# Patient Record
Sex: Male | Born: 1955 | Race: Black or African American | Hispanic: No
Health system: Southern US, Community
[De-identification: ages and names within clinical notes are randomized; demographics above are authoritative.]

## PROBLEM LIST (undated history)

## (undated) DIAGNOSIS — J1282 Pneumonia due to coronavirus disease 2019: Secondary | ICD-10-CM

## (undated) DIAGNOSIS — U071 COVID-19: Secondary | ICD-10-CM

## (undated) DIAGNOSIS — J9621 Acute and chronic respiratory failure with hypoxia: Secondary | ICD-10-CM

## (undated) DIAGNOSIS — J80 Acute respiratory distress syndrome: Secondary | ICD-10-CM

---

## 2020-09-05 ENCOUNTER — Inpatient Hospital Stay
Admission: RE | Admit: 2020-09-05 | Discharge: 2020-09-29 | Disposition: A | Discharge status: Other Acute Inpatient Hospital | Payer: BC Managed Care – PPO | Attending: Internal Medicine | Admitting: Internal Medicine

## 2020-09-05 DIAGNOSIS — J90 Pleural effusion, not elsewhere classified: Secondary | ICD-10-CM

## 2020-09-05 DIAGNOSIS — J80 Acute respiratory distress syndrome: Secondary | ICD-10-CM | POA: Diagnosis present

## 2020-09-05 DIAGNOSIS — U071 COVID-19: Secondary | ICD-10-CM | POA: Diagnosis present

## 2020-09-05 DIAGNOSIS — J189 Pneumonia, unspecified organism: Secondary | ICD-10-CM

## 2020-09-05 DIAGNOSIS — N19 Unspecified kidney failure: Secondary | ICD-10-CM

## 2020-09-05 DIAGNOSIS — J9621 Acute and chronic respiratory failure with hypoxia: Secondary | ICD-10-CM | POA: Diagnosis present

## 2020-09-05 DIAGNOSIS — K567 Ileus, unspecified: Secondary | ICD-10-CM

## 2020-09-05 DIAGNOSIS — R509 Fever, unspecified: Secondary | ICD-10-CM

## 2020-09-05 DIAGNOSIS — T85598A Other mechanical complication of other gastrointestinal prosthetic devices, implants and grafts, initial encounter: Secondary | ICD-10-CM

## 2020-09-05 DIAGNOSIS — J9690 Respiratory failure, unspecified, unspecified whether with hypoxia or hypercapnia: Secondary | ICD-10-CM

## 2020-09-05 DIAGNOSIS — J1282 Pneumonia due to coronavirus disease 2019: Secondary | ICD-10-CM | POA: Diagnosis present

## 2020-09-05 DIAGNOSIS — R6889 Other general symptoms and signs: Secondary | ICD-10-CM

## 2020-09-05 HISTORY — DX: Acute and chronic respiratory failure with hypoxia: J96.21

## 2020-09-05 HISTORY — DX: Pneumonia due to coronavirus disease 2019: J12.82

## 2020-09-05 HISTORY — DX: Acute respiratory distress syndrome: J80

## 2020-09-05 HISTORY — DX: COVID-19: U07.1

## 2020-09-06 ENCOUNTER — Encounter: Payer: Self-pay | Admitting: Internal Medicine

## 2020-09-06 ENCOUNTER — Other Ambulatory Visit (HOSPITAL_COMMUNITY): Payer: BC Managed Care – PPO

## 2020-09-06 DIAGNOSIS — J80 Acute respiratory distress syndrome: Secondary | ICD-10-CM | POA: Diagnosis not present

## 2020-09-06 DIAGNOSIS — J9621 Acute and chronic respiratory failure with hypoxia: Secondary | ICD-10-CM

## 2020-09-06 DIAGNOSIS — U071 COVID-19: Secondary | ICD-10-CM | POA: Diagnosis not present

## 2020-09-06 DIAGNOSIS — J1282 Pneumonia due to coronavirus disease 2019: Secondary | ICD-10-CM

## 2020-09-06 LAB — CBC
HCT: 42.5 % (ref 39.0–52.0)
Hemoglobin: 14.2 g/dL (ref 13.0–17.0)
MCH: 28.5 pg (ref 26.0–34.0)
MCHC: 33.4 g/dL (ref 30.0–36.0)
MCV: 85.3 fL (ref 80.0–100.0)
Platelets: 243 10*3/uL (ref 150–400)
RBC: 4.98 MIL/uL (ref 4.22–5.81)
RDW: 14.8 % (ref 11.5–15.5)
WBC: 17.4 10*3/uL — ABNORMAL HIGH (ref 4.0–10.5)
nRBC: 0 % (ref 0.0–0.2)

## 2020-09-06 LAB — COMPREHENSIVE METABOLIC PANEL
ALT: 124 U/L — ABNORMAL HIGH (ref 0–44)
AST: 47 U/L — ABNORMAL HIGH (ref 15–41)
Albumin: 2.9 g/dL — ABNORMAL LOW (ref 3.5–5.0)
Alkaline Phosphatase: 75 U/L (ref 38–126)
Anion gap: 10 (ref 5–15)
BUN: 34 mg/dL — ABNORMAL HIGH (ref 8–23)
CO2: 22 mmol/L (ref 22–32)
Calcium: 8.9 mg/dL (ref 8.9–10.3)
Chloride: 101 mmol/L (ref 98–111)
Creatinine, Ser: 0.71 mg/dL (ref 0.61–1.24)
GFR, Estimated: >60 mL/min (ref >60)
Glucose, Bld: 100 mg/dL — ABNORMAL HIGH (ref 70–99)
Potassium: 3.7 mmol/L (ref 3.5–5.1)
Sodium: 133 mmol/L — ABNORMAL LOW (ref 135–145)
Total Bilirubin: 1.2 mg/dL (ref 0.3–1.2)
Total Protein: 7 g/dL (ref 6.5–8.1)

## 2020-09-06 LAB — URINALYSIS, ROUTINE W REFLEX MICROSCOPIC
Bilirubin Urine: NEGATIVE
Glucose, UA: NEGATIVE mg/dL
Hgb urine dipstick: NEGATIVE
Ketones, ur: NEGATIVE mg/dL
Leukocytes,Ua: NEGATIVE
Nitrite: NEGATIVE
Protein, ur: NEGATIVE mg/dL
Specific Gravity, Urine: 1.018 (ref 1.005–1.030)
pH: 5 (ref 5.0–8.0)

## 2020-09-06 NOTE — Consult Note (Signed)
Pulmonary Critical Care Medicine [facility] [place]  PULMONARY SERVICE  Date of Service: 09/06/2020  PULMONARY CRITICAL CARE Edward Andrade  XNA:[id number]  DOB: [birth year]   DOA: 09/05/2020  Referring Physician: Carron Curie, MD  HPI: Edward Andrade is a 64 y.o. male seen for follow up of Acute on Chronic Respiratory Failure.  Patient has multiple medical problems including colon cancer and COVID-19 infection came into the hospital because of increasing shortness of breath.  Patient was apparently admitted with a history of COVID-19 on October 16 ended up on the ventilator mechanical ventilation and was subsequently extubated on November 8.  Transferred to this facility on high flow oxygen at 30 L and 50% FiO2.  Patient had a complicated course was treated with bacterial infection with antibiotics.  Patient also had low blood pressure requiring pressors and elevation of liver enzymes.  Transferred here for further management.  Review of Systems:  ROS performed and is unremarkable other than noted above.  Past medical history: Cancer of the colon COVID-19 virus infection COVID-19 pneumonia Transaminitis Hyperglycemia  Past surgical history: Intubation mechanical ventilation  Social history: No alcohol tobacco or drug abuse Patient is a former smoker  Medications: Reviewed on Rounds  Physical Exam:  Vitals: Temperature 96.8 pulse 85 respiratory rate 20 blood pressure is 138/81 saturations 96%  Ventilator Settings off the ventilator on high flow oxygen 30 L with an FiO2 of 50%  . General: Comfortable at this time . Eyes: Grossly normal lids, irises & conjunctiva . ENT: grossly tongue is normal . Neck: no obvious mass . Cardiovascular: S1-S2 normal no gallop or rub . Respiratory: No rhonchi no rales . Abdomen: Soft and nontender . Skin: no rash seen on limited exam . Musculoskeletal: not rigid . Psychiatric:unable to assess . Neurologic: no  seizure no involuntary movements         Labs on Admission:  Basic Metabolic Panel: Recent Labs  Lab 09/06/20 0413  NA 133*  K 3.7  CL 101  CO2 22  GLUCOSE 100*  BUN 34*  CREATININE 0.71  CALCIUM 8.9    No results for input(s): PHART, PCO2ART, PO2ART, HCO3, O2SAT in the last 168 hours.  Liver Function Tests: Recent Labs  Lab 09/06/20 0413  AST 47*  ALT 124*  ALKPHOS 75  BILITOT 1.2  PROT 7.0  ALBUMIN 2.9*   No results for input(s): LIPASE, AMYLASE in the last 168 hours. No results for input(s): AMMONIA in the last 168 hours.  CBC: Recent Labs  Lab 09/06/20 0413  WBC 17.4*  HGB 14.2  HCT 42.5  MCV 85.3  PLT 243    Cardiac Enzymes: No results for input(s): CKTOTAL, CKMB, CKMBINDEX, TROPONINI in the last 168 hours.  BNP (last 3 results) No results for input(s): BNP in the last 8760 hours.  ProBNP (last 3 results) No results for input(s): PROBNP in the last 8760 hours.   Radiological Exams on Admission: DG Abd Portable 1V  Result Date: 09/06/2020 CLINICAL DATA:  Impaired nasogastric tube. EXAM: PORTABLE ABDOMEN - 1 VIEW COMPARISON:  09/06/2020. FINDINGS: Interim removal of feeding tube. Interim placement of NG tube. NG tube coiled over the region of the upper stomach. Distended loops of small and large bowel noted. This could represent adynamic ileus. To demonstrate resolution and to exclude bowel obstruction follow-up exam suggested. No free air bibasilar atelectasis. Degenerative changes scoliosis thoracolumbar spine. IMPRESSION: 1. Interim removal of feeding tube. Interim placement of NG tube. NG tube coiled over the  region of the upper stomach. 2. Distended loops of small and large bowel noted. This could represent adynamic ileus. To demonstrate resolution to exclude bowel obstruction follow-up exam suggested. Electronically Signed   By: Maisie Fus  Register   On: 09/06/2020 06:44   DG Abd Portable 1V  Result Date: 09/06/2020 CLINICAL DATA:  Nasoenteric  feeding tube placement EXAM: PORTABLE ABDOMEN - 1 VIEW COMPARISON:  None. FINDINGS: A nasoenteric feeding tube is seen coiled within the upper abdomen, likely within the third portion of the duodenum. Visualized abdominal gas pattern is unremarkable. IMPRESSION: Nasoenteric feeding tube likely within the third portion of the duodenum. Electronically Signed   By: Helyn Numbers MD   On: 09/06/2020 02:56    Assessment/Plan Active Problems:   Acute on chronic respiratory failure with hypoxia (HCC)   COVID-19 virus infection   Pneumonia due to COVID-19 virus   Acute respiratory distress syndrome (ARDS) due to COVID-19 virus (HCC)   1. Acute on chronic respiratory failure hypoxia patient is currently on high flow oxygen goal is going to be to wean the flow rate and FiO2 down as tolerated.  Spoke with respiratory therapy on rounds to move aggressively.  Patient continues to have issues with coughing however 2. COVID-19 virus infection in recovery we will continue to monitor closely. 3. COVID-19 pneumonia treated patient had subsequent bacterial infection was treated with antibiotics. 4. ARDS secondary to COVID-19.  The most recent films showing some residual changes will need to monitor to resolution.  I have personally seen and evaluated the patient, evaluated laboratory and imaging results, formulated the assessment and plan and placed orders. The Patient requires high complexity decision making with multiple systems involvement.  Case was discussed on Rounds with the Respiratory Therapy Director and the Respiratory staff Time Spent  Yevonne Pax, MD [facility] Pulmonary Critical Care Medicine Sleep Medicine

## 2020-09-07 ENCOUNTER — Other Ambulatory Visit (HOSPITAL_COMMUNITY): Payer: BC Managed Care – PPO

## 2020-09-07 DIAGNOSIS — J1282 Pneumonia due to coronavirus disease 2019: Secondary | ICD-10-CM | POA: Diagnosis not present

## 2020-09-07 DIAGNOSIS — J9621 Acute and chronic respiratory failure with hypoxia: Secondary | ICD-10-CM | POA: Diagnosis not present

## 2020-09-07 DIAGNOSIS — U071 COVID-19: Secondary | ICD-10-CM | POA: Diagnosis not present

## 2020-09-07 DIAGNOSIS — J80 Acute respiratory distress syndrome: Secondary | ICD-10-CM | POA: Diagnosis not present

## 2020-09-07 LAB — BASIC METABOLIC PANEL
Anion gap: 8 (ref 5–15)
BUN: 29 mg/dL — ABNORMAL HIGH (ref 8–23)
CO2: 26 mmol/L (ref 22–32)
Calcium: 8.7 mg/dL — ABNORMAL LOW (ref 8.9–10.3)
Chloride: 102 mmol/L (ref 98–111)
Creatinine, Ser: 0.66 mg/dL (ref 0.61–1.24)
GFR, Estimated: >60 mL/min (ref >60)
Glucose, Bld: 98 mg/dL (ref 70–99)
Potassium: 3.5 mmol/L (ref 3.5–5.1)
Sodium: 136 mmol/L (ref 135–145)

## 2020-09-07 LAB — MAGNESIUM: Magnesium: 2.2 mg/dL (ref 1.7–2.4)

## 2020-09-07 NOTE — Progress Notes (Signed)
Pulmonary Critical Care Medicine [facility] [place]   PULMONARY CRITICAL CARE SERVICE  PROGRESS NOTE  Date of Service: 09/07/2020  Edward Andrade  MHD:[id number]  DOB: July 29, 1956   DOA: 09/05/2020  Referring Physician: Carron Curie, MD  HPI: Edward Andrade is a 64 y.o. male seen for follow up of Acute on Chronic Respiratory Failure.  Patient is on 5 L of O2 good saturations are noted.  Medications: Reviewed on Rounds  Physical Exam:  Vitals: Temperature is 96.9 pulse 61 respiratory rate 16 blood pressure 100/61 saturations 99%  Ventilator Settings off the ventilator on 5 L of oxygen  . General: Comfortable at this time . Eyes: Grossly normal lids, irises & conjunctiva . ENT: grossly tongue is normal . Neck: no obvious mass . Cardiovascular: S1 S2 normal no gallop . Respiratory: No rhonchi no rales noted at this time. . Abdomen: soft . Skin: no rash seen on limited exam . Musculoskeletal: not rigid . Psychiatric:unable to assess . Neurologic: no seizure no involuntary movements         Lab Data:   Basic Metabolic Panel: Recent Labs  Lab 09/06/20 0413 09/07/20 0507  NA 133* 136  K 3.7 3.5  CL 101 102  CO2 22 26  GLUCOSE 100* 98  BUN 34* 29*  CREATININE 0.71 0.66  CALCIUM 8.9 8.7*  MG  --  2.2    ABG: No results for input(s): PHART, PCO2ART, PO2ART, HCO3, O2SAT in the last 168 hours.  Liver Function Tests: Recent Labs  Lab 09/06/20 0413  AST 47*  ALT 124*  ALKPHOS 75  BILITOT 1.2  PROT 7.0  ALBUMIN 2.9*   No results for input(s): LIPASE, AMYLASE in the last 168 hours. No results for input(s): AMMONIA in the last 168 hours.  CBC: Recent Labs  Lab 09/06/20 0413  WBC 17.4*  HGB 14.2  HCT 42.5  MCV 85.3  PLT 243    Cardiac Enzymes: No results for input(s): CKTOTAL, CKMB, CKMBINDEX, TROPONINI in the last 168 hours.  BNP (last 3 results) No results for input(s): BNP in the last 8760 hours.  ProBNP (last 3 results) No results  for input(s): PROBNP in the last 8760 hours.  Radiological Exams: DG CHEST PORT 1 VIEW  Result Date: 09/07/2020 CLINICAL DATA:  Shortness of breath. EXAM: PORTABLE CHEST 1 VIEW COMPARISON:  None. FINDINGS: This is a low volume study. A LEFT PICC line is present with tip overlying the UPPER RIGHT atrium. An NG tube is noted entering the stomach. Mild diffuse bilateral airspace/ground-glass opacities are noted. No pleural effusion, pneumothorax or acute bony abnormality. IMPRESSION: 1. Mild diffuse bilateral airspace/ground-glass opacities - question edema versus infection. 2. LEFT PICC line with tip overlying the UPPER RIGHT atrium. Electronically Signed   By: Harmon Pier M.D.   On: 09/07/2020 08:35   DG Abd Portable 1V  Result Date: 09/07/2020 CLINICAL DATA:  NG tube placement. EXAM: PORTABLE ABDOMEN - 1 VIEW COMPARISON:  09/06/2020 FINDINGS: An NG tube is present with tip overlying the mid stomach. Mild gaseous distension of the colon is again noted. IMPRESSION: NG tube with tip overlying the mid stomach. Electronically Signed   By: Harmon Pier M.D.   On: 09/07/2020 08:36   DG Abd Portable 1V  Result Date: 09/06/2020 CLINICAL DATA:  Impaired nasogastric tube. EXAM: PORTABLE ABDOMEN - 1 VIEW COMPARISON:  09/06/2020. FINDINGS: Interim removal of feeding tube. Interim placement of NG tube. NG tube coiled over the region of the upper stomach. Distended loops of  small and large bowel noted. This could represent adynamic ileus. To demonstrate resolution and to exclude bowel obstruction follow-up exam suggested. No free air bibasilar atelectasis. Degenerative changes scoliosis thoracolumbar spine. IMPRESSION: 1. Interim removal of feeding tube. Interim placement of NG tube. NG tube coiled over the region of the upper stomach. 2. Distended loops of small and large bowel noted. This could represent adynamic ileus. To demonstrate resolution to exclude bowel obstruction follow-up exam suggested. Electronically  Signed   By: Maisie Fus  Register   On: 09/06/2020 06:44   DG Abd Portable 1V  Result Date: 09/06/2020 CLINICAL DATA:  Nasoenteric feeding tube placement EXAM: PORTABLE ABDOMEN - 1 VIEW COMPARISON:  None. FINDINGS: A nasoenteric feeding tube is seen coiled within the upper abdomen, likely within the third portion of the duodenum. Visualized abdominal gas pattern is unremarkable. IMPRESSION: Nasoenteric feeding tube likely within the third portion of the duodenum. Electronically Signed   By: Helyn Numbers MD   On: 09/06/2020 02:56    Assessment/Plan Active Problems:   Acute on chronic respiratory failure with hypoxia (HCC)   COVID-19 virus infection   Pneumonia due to COVID-19 virus   Acute respiratory distress syndrome (ARDS) due to COVID-19 virus (HCC)   1. Acute on chronic respiratory failure hypoxia we will continue with oxygen therapy titrate as tolerated.  Right now is down to 5 L 2. COVID-19 virus infection in recovery 3. Pneumonia due to COVID-19 treated 4. ARDS treated slow improvement   I have personally seen and evaluated the patient, evaluated laboratory and imaging results, formulated the assessment and plan and placed orders. The Patient requires high complexity decision making with multiple systems involvement.  Rounds were done with the Respiratory Therapy Director and Staff therapists and discussed with nursing staff also.  Yevonne Pax, MD [facility] Pulmonary Critical Care Medicine Sleep Medicine

## 2020-09-08 DIAGNOSIS — J1282 Pneumonia due to coronavirus disease 2019: Secondary | ICD-10-CM | POA: Diagnosis not present

## 2020-09-08 DIAGNOSIS — J80 Acute respiratory distress syndrome: Secondary | ICD-10-CM | POA: Diagnosis not present

## 2020-09-08 DIAGNOSIS — J9621 Acute and chronic respiratory failure with hypoxia: Secondary | ICD-10-CM | POA: Diagnosis not present

## 2020-09-08 DIAGNOSIS — U071 COVID-19: Secondary | ICD-10-CM | POA: Diagnosis not present

## 2020-09-08 LAB — BASIC METABOLIC PANEL
Anion gap: 10 (ref 5–15)
BUN: 25 mg/dL — ABNORMAL HIGH (ref 8–23)
CO2: 25 mmol/L (ref 22–32)
Calcium: 9 mg/dL (ref 8.9–10.3)
Chloride: 102 mmol/L (ref 98–111)
Creatinine, Ser: 0.61 mg/dL (ref 0.61–1.24)
GFR, Estimated: >60 mL/min (ref >60)
Glucose, Bld: 107 mg/dL — ABNORMAL HIGH (ref 70–99)
Potassium: 3.4 mmol/L — ABNORMAL LOW (ref 3.5–5.1)
Sodium: 137 mmol/L (ref 135–145)

## 2020-09-08 LAB — URINE CULTURE: Culture: 20000 — AB

## 2020-09-08 LAB — MAGNESIUM: Magnesium: 2 mg/dL (ref 1.7–2.4)

## 2020-09-08 NOTE — Progress Notes (Signed)
Pulmonary Critical Care Medicine [facility] [place]   PULMONARY CRITICAL CARE SERVICE  PROGRESS NOTE  Date of Service: 09/08/2020  Edward Andrade  YIR:[id number]  DOB: 06-17-1956   DOA: 09/05/2020  Referring Physician: Carron Curie, MD  HPI: Edward Andrade is a 64 y.o. male seen for follow up of Acute on Chronic Respiratory Failure.  Currently off the ventilator on 5 L of oxygen good saturations are noted  Medications: Reviewed on Rounds  Physical Exam:  Vitals: Temperature is 96.0 pulse 84 respiratory 26 blood pressure is 141/83 saturations 99%  Ventilator Settings off the vent on 5 L  . General: Comfortable at this time . Eyes: Grossly normal lids, irises & conjunctiva . ENT: grossly tongue is normal . Neck: no obvious mass . Cardiovascular: S1 S2 normal no gallop . Respiratory: No rhonchi no rales are noted at this time . Abdomen: soft . Skin: no rash seen on limited exam . Musculoskeletal: not rigid . Psychiatric:unable to assess . Neurologic: no seizure no involuntary movements         Lab Data:   Basic Metabolic Panel: Recent Labs  Lab 09/06/20 0413 09/07/20 0507 09/08/20 0626  NA 133* 136 137  K 3.7 3.5 3.4*  CL 101 102 102  CO2 22 26 25   GLUCOSE 100* 98 107*  BUN 34* 29* 25*  CREATININE 0.71 0.66 0.61  CALCIUM 8.9 8.7* 9.0  MG  --  2.2 2.0    ABG: No results for input(s): PHART, PCO2ART, PO2ART, HCO3, O2SAT in the last 168 hours.  Liver Function Tests: Recent Labs  Lab 09/06/20 0413  AST 47*  ALT 124*  ALKPHOS 75  BILITOT 1.2  PROT 7.0  ALBUMIN 2.9*   No results for input(s): LIPASE, AMYLASE in the last 168 hours. No results for input(s): AMMONIA in the last 168 hours.  CBC: Recent Labs  Lab 09/06/20 0413  WBC 17.4*  HGB 14.2  HCT 42.5  MCV 85.3  PLT 243    Cardiac Enzymes: No results for input(s): CKTOTAL, CKMB, CKMBINDEX, TROPONINI in the last 168 hours.  BNP (last 3 results) No results for input(s): BNP in  the last 8760 hours.  ProBNP (last 3 results) No results for input(s): PROBNP in the last 8760 hours.  Radiological Exams: DG CHEST PORT 1 VIEW  Result Date: 09/07/2020 CLINICAL DATA:  Shortness of breath. EXAM: PORTABLE CHEST 1 VIEW COMPARISON:  None. FINDINGS: This is a low volume study. A LEFT PICC line is present with tip overlying the UPPER RIGHT atrium. An NG tube is noted entering the stomach. Mild diffuse bilateral airspace/ground-glass opacities are noted. No pleural effusion, pneumothorax or acute bony abnormality. IMPRESSION: 1. Mild diffuse bilateral airspace/ground-glass opacities - question edema versus infection. 2. LEFT PICC line with tip overlying the UPPER RIGHT atrium. Electronically Signed   By: 09/09/2020 M.D.   On: 09/07/2020 08:35   DG Abd Portable 1V  Result Date: 09/07/2020 CLINICAL DATA:  NG tube placement. EXAM: PORTABLE ABDOMEN - 1 VIEW COMPARISON:  09/06/2020 FINDINGS: An NG tube is present with tip overlying the mid stomach. Mild gaseous distension of the colon is again noted. IMPRESSION: NG tube with tip overlying the mid stomach. Electronically Signed   By: 09/08/2020 M.D.   On: 09/07/2020 08:36    Assessment/Plan Active Problems:   Acute on chronic respiratory failure with hypoxia (HCC)   COVID-19 virus infection   Pneumonia due to COVID-19 virus   Acute respiratory distress syndrome (ARDS) due to  COVID-19 virus (HCC)   1. Acute on chronic respiratory failure hypoxia we will continue with oxygen therapy titrate down as tolerated. 2. COVID-19 virus infection in recovery 3. Pneumonia due to COVID-19 treated 4. ARDS treated slow improvement   I have personally seen and evaluated the patient, evaluated laboratory and imaging results, formulated the assessment and plan and placed orders. The Patient requires high complexity decision making with multiple systems involvement.  Rounds were done with the Respiratory Therapy Director and Staff therapists and  discussed with nursing staff also.  Yevonne Pax, MD [facility] Pulmonary Critical Care Medicine Sleep Medicine

## 2020-09-09 ENCOUNTER — Other Ambulatory Visit (HOSPITAL_COMMUNITY): Payer: BC Managed Care – PPO

## 2020-09-09 LAB — CBC
HCT: 40.6 % (ref 39.0–52.0)
Hemoglobin: 13.2 g/dL (ref 13.0–17.0)
MCH: 28.1 pg (ref 26.0–34.0)
MCHC: 32.5 g/dL (ref 30.0–36.0)
MCV: 86.4 fL (ref 80.0–100.0)
Platelets: 196 10*3/uL (ref 150–400)
RBC: 4.7 MIL/uL (ref 4.22–5.81)
RDW: 14.8 % (ref 11.5–15.5)
WBC: 8.6 10*3/uL (ref 4.0–10.5)
nRBC: 0 % (ref 0.0–0.2)

## 2020-09-09 LAB — MAGNESIUM: Magnesium: 2 mg/dL (ref 1.7–2.4)

## 2020-09-09 LAB — BASIC METABOLIC PANEL
Anion gap: 10 (ref 5–15)
BUN: 21 mg/dL (ref 8–23)
CO2: 26 mmol/L (ref 22–32)
Calcium: 8.8 mg/dL — ABNORMAL LOW (ref 8.9–10.3)
Chloride: 100 mmol/L (ref 98–111)
Creatinine, Ser: 0.61 mg/dL (ref 0.61–1.24)
GFR, Estimated: >60 mL/min (ref >60)
Glucose, Bld: 124 mg/dL — ABNORMAL HIGH (ref 70–99)
Potassium: 3.3 mmol/L — ABNORMAL LOW (ref 3.5–5.1)
Sodium: 136 mmol/L (ref 135–145)

## 2020-09-10 LAB — BASIC METABOLIC PANEL
Anion gap: 12 (ref 5–15)
BUN: 14 mg/dL (ref 8–23)
CO2: 23 mmol/L (ref 22–32)
Calcium: 8.5 mg/dL — ABNORMAL LOW (ref 8.9–10.3)
Chloride: 101 mmol/L (ref 98–111)
Creatinine, Ser: 0.51 mg/dL — ABNORMAL LOW (ref 0.61–1.24)
GFR, Estimated: >60 mL/min (ref >60)
Glucose, Bld: 88 mg/dL (ref 70–99)
Potassium: 3.5 mmol/L (ref 3.5–5.1)
Sodium: 136 mmol/L (ref 135–145)

## 2020-09-10 LAB — CBC
HCT: 40 % (ref 39.0–52.0)
Hemoglobin: 13 g/dL (ref 13.0–17.0)
MCH: 28.1 pg (ref 26.0–34.0)
MCHC: 32.5 g/dL (ref 30.0–36.0)
MCV: 86.4 fL (ref 80.0–100.0)
Platelets: 188 10*3/uL (ref 150–400)
RBC: 4.63 MIL/uL (ref 4.22–5.81)
RDW: 14.9 % (ref 11.5–15.5)
WBC: 7 10*3/uL (ref 4.0–10.5)
nRBC: 0 % (ref 0.0–0.2)

## 2020-09-10 LAB — PHOSPHORUS: Phosphorus: 3.7 mg/dL (ref 2.5–4.6)

## 2020-09-10 LAB — MAGNESIUM: Magnesium: 1.9 mg/dL (ref 1.7–2.4)

## 2020-09-11 ENCOUNTER — Other Ambulatory Visit (HOSPITAL_COMMUNITY): Payer: BC Managed Care – PPO

## 2020-09-11 LAB — BASIC METABOLIC PANEL
Anion gap: 7 (ref 5–15)
BUN: 14 mg/dL (ref 8–23)
CO2: 23 mmol/L (ref 22–32)
Calcium: 8.7 mg/dL — ABNORMAL LOW (ref 8.9–10.3)
Chloride: 107 mmol/L (ref 98–111)
Creatinine, Ser: 0.61 mg/dL (ref 0.61–1.24)
GFR, Estimated: >60 mL/min (ref >60)
Glucose, Bld: 83 mg/dL (ref 70–99)
Potassium: 4.1 mmol/L (ref 3.5–5.1)
Sodium: 137 mmol/L (ref 135–145)

## 2020-09-11 LAB — PHOSPHORUS: Phosphorus: 3.5 mg/dL (ref 2.5–4.6)

## 2020-09-11 LAB — MAGNESIUM: Magnesium: 1.9 mg/dL (ref 1.7–2.4)

## 2020-09-13 ENCOUNTER — Other Ambulatory Visit (HOSPITAL_COMMUNITY): Payer: BC Managed Care – PPO

## 2020-09-13 LAB — BASIC METABOLIC PANEL
Anion gap: 9 (ref 5–15)
BUN: 11 mg/dL (ref 8–23)
CO2: 21 mmol/L — ABNORMAL LOW (ref 22–32)
Calcium: 8.6 mg/dL — ABNORMAL LOW (ref 8.9–10.3)
Chloride: 107 mmol/L (ref 98–111)
Creatinine, Ser: 0.47 mg/dL — ABNORMAL LOW (ref 0.61–1.24)
GFR, Estimated: >60 mL/min (ref >60)
Glucose, Bld: 89 mg/dL (ref 70–99)
Potassium: 3.1 mmol/L — ABNORMAL LOW (ref 3.5–5.1)
Sodium: 137 mmol/L (ref 135–145)

## 2020-09-13 LAB — MAGNESIUM: Magnesium: 1.7 mg/dL (ref 1.7–2.4)

## 2020-09-13 LAB — CBC
HCT: 37.3 % — ABNORMAL LOW (ref 39.0–52.0)
Hemoglobin: 12.1 g/dL — ABNORMAL LOW (ref 13.0–17.0)
MCH: 28.4 pg (ref 26.0–34.0)
MCHC: 32.4 g/dL (ref 30.0–36.0)
MCV: 87.6 fL (ref 80.0–100.0)
Platelets: 157 10*3/uL (ref 150–400)
RBC: 4.26 MIL/uL (ref 4.22–5.81)
RDW: 15.1 % (ref 11.5–15.5)
WBC: 5 10*3/uL (ref 4.0–10.5)
nRBC: 0 % (ref 0.0–0.2)

## 2020-09-13 LAB — PHOSPHORUS: Phosphorus: 3.5 mg/dL (ref 2.5–4.6)

## 2020-09-14 LAB — BASIC METABOLIC PANEL
Anion gap: 9 (ref 5–15)
BUN: 10 mg/dL (ref 8–23)
CO2: 22 mmol/L (ref 22–32)
Calcium: 8.3 mg/dL — ABNORMAL LOW (ref 8.9–10.3)
Chloride: 106 mmol/L (ref 98–111)
Creatinine, Ser: 0.58 mg/dL — ABNORMAL LOW (ref 0.61–1.24)
GFR, Estimated: >60 mL/min (ref >60)
Glucose, Bld: 91 mg/dL (ref 70–99)
Potassium: 3.3 mmol/L — ABNORMAL LOW (ref 3.5–5.1)
Sodium: 137 mmol/L (ref 135–145)

## 2020-09-14 LAB — PHOSPHORUS: Phosphorus: 3.1 mg/dL (ref 2.5–4.6)

## 2020-09-14 LAB — MAGNESIUM: Magnesium: 1.8 mg/dL (ref 1.7–2.4)

## 2020-09-15 ENCOUNTER — Other Ambulatory Visit (HOSPITAL_COMMUNITY): Payer: BC Managed Care – PPO

## 2020-09-15 LAB — BASIC METABOLIC PANEL
Anion gap: 7 (ref 5–15)
BUN: 9 mg/dL (ref 8–23)
CO2: 25 mmol/L (ref 22–32)
Calcium: 8.3 mg/dL — ABNORMAL LOW (ref 8.9–10.3)
Chloride: 104 mmol/L (ref 98–111)
Creatinine, Ser: 0.5 mg/dL — ABNORMAL LOW (ref 0.61–1.24)
GFR, Estimated: >60 mL/min (ref >60)
Glucose, Bld: 92 mg/dL (ref 70–99)
Potassium: 3.4 mmol/L — ABNORMAL LOW (ref 3.5–5.1)
Sodium: 136 mmol/L (ref 135–145)

## 2020-09-15 LAB — CBC
HCT: 37.2 % — ABNORMAL LOW (ref 39.0–52.0)
Hemoglobin: 12.2 g/dL — ABNORMAL LOW (ref 13.0–17.0)
MCH: 28.5 pg (ref 26.0–34.0)
MCHC: 32.8 g/dL (ref 30.0–36.0)
MCV: 86.9 fL (ref 80.0–100.0)
Platelets: 163 10*3/uL (ref 150–400)
RBC: 4.28 MIL/uL (ref 4.22–5.81)
RDW: 15.3 % (ref 11.5–15.5)
WBC: 6.3 10*3/uL (ref 4.0–10.5)
nRBC: 0 % (ref 0.0–0.2)

## 2020-09-15 LAB — PHOSPHORUS: Phosphorus: 2.7 mg/dL (ref 2.5–4.6)

## 2020-09-15 LAB — MAGNESIUM: Magnesium: 1.8 mg/dL (ref 1.7–2.4)

## 2020-09-16 LAB — BASIC METABOLIC PANEL
Anion gap: 8 (ref 5–15)
BUN: 8 mg/dL (ref 8–23)
CO2: 24 mmol/L (ref 22–32)
Calcium: 8.3 mg/dL — ABNORMAL LOW (ref 8.9–10.3)
Chloride: 103 mmol/L (ref 98–111)
Creatinine, Ser: 0.49 mg/dL — ABNORMAL LOW (ref 0.61–1.24)
GFR, Estimated: >60 mL/min (ref >60)
Glucose, Bld: 88 mg/dL (ref 70–99)
Potassium: 3.1 mmol/L — ABNORMAL LOW (ref 3.5–5.1)
Sodium: 135 mmol/L (ref 135–145)

## 2020-09-16 LAB — CBC
HCT: 36.3 % — ABNORMAL LOW (ref 39.0–52.0)
Hemoglobin: 11.7 g/dL — ABNORMAL LOW (ref 13.0–17.0)
MCH: 28 pg (ref 26.0–34.0)
MCHC: 32.2 g/dL (ref 30.0–36.0)
MCV: 86.8 fL (ref 80.0–100.0)
Platelets: 163 10*3/uL (ref 150–400)
RBC: 4.18 MIL/uL — ABNORMAL LOW (ref 4.22–5.81)
RDW: 15.1 % (ref 11.5–15.5)
WBC: 6.2 10*3/uL (ref 4.0–10.5)
nRBC: 0 % (ref 0.0–0.2)

## 2020-09-16 LAB — PHOSPHORUS: Phosphorus: 2.3 mg/dL — ABNORMAL LOW (ref 2.5–4.6)

## 2020-09-16 LAB — MAGNESIUM: Magnesium: 1.5 mg/dL — ABNORMAL LOW (ref 1.7–2.4)

## 2020-09-17 ENCOUNTER — Other Ambulatory Visit (HOSPITAL_COMMUNITY): Payer: BC Managed Care – PPO

## 2020-09-17 LAB — TRIGLYCERIDES: Triglycerides: 158 mg/dL — ABNORMAL HIGH (ref <150)

## 2020-09-17 LAB — MAGNESIUM: Magnesium: 1.8 mg/dL (ref 1.7–2.4)

## 2020-09-17 LAB — PHOSPHORUS: Phosphorus: 2.5 mg/dL (ref 2.5–4.6)

## 2020-09-17 LAB — POTASSIUM: Potassium: 3.1 mmol/L — ABNORMAL LOW (ref 3.5–5.1)

## 2020-09-18 ENCOUNTER — Other Ambulatory Visit (HOSPITAL_COMMUNITY): Payer: BC Managed Care – PPO

## 2020-09-18 LAB — BASIC METABOLIC PANEL
Anion gap: 11 (ref 5–15)
BUN: 10 mg/dL (ref 8–23)
CO2: 20 mmol/L — ABNORMAL LOW (ref 22–32)
Calcium: 8.7 mg/dL — ABNORMAL LOW (ref 8.9–10.3)
Chloride: 104 mmol/L (ref 98–111)
Creatinine, Ser: 0.35 mg/dL — ABNORMAL LOW (ref 0.61–1.24)
GFR, Estimated: >60 mL/min (ref >60)
Glucose, Bld: 84 mg/dL (ref 70–99)
Potassium: 3.5 mmol/L (ref 3.5–5.1)
Sodium: 135 mmol/L (ref 135–145)

## 2020-09-18 LAB — MAGNESIUM: Magnesium: 1.7 mg/dL (ref 1.7–2.4)

## 2020-09-19 ENCOUNTER — Other Ambulatory Visit (HOSPITAL_COMMUNITY): Payer: BC Managed Care – PPO

## 2020-09-19 LAB — COMPREHENSIVE METABOLIC PANEL
ALT: 77 U/L — ABNORMAL HIGH (ref 0–44)
AST: 34 U/L (ref 15–41)
Albumin: 2.7 g/dL — ABNORMAL LOW (ref 3.5–5.0)
Alkaline Phosphatase: 67 U/L (ref 38–126)
Anion gap: 12 (ref 5–15)
BUN: 10 mg/dL (ref 8–23)
CO2: 22 mmol/L (ref 22–32)
Calcium: 8.9 mg/dL (ref 8.9–10.3)
Chloride: 102 mmol/L (ref 98–111)
Creatinine, Ser: 0.4 mg/dL — ABNORMAL LOW (ref 0.61–1.24)
GFR, Estimated: >60 mL/min (ref >60)
Glucose, Bld: 101 mg/dL — ABNORMAL HIGH (ref 70–99)
Potassium: 3.8 mmol/L (ref 3.5–5.1)
Sodium: 136 mmol/L (ref 135–145)
Total Bilirubin: 1.1 mg/dL (ref 0.3–1.2)
Total Protein: 5.9 g/dL — ABNORMAL LOW (ref 6.5–8.1)

## 2020-09-19 LAB — CBC
HCT: 37.6 % — ABNORMAL LOW (ref 39.0–52.0)
Hemoglobin: 12.6 g/dL — ABNORMAL LOW (ref 13.0–17.0)
MCH: 29 pg (ref 26.0–34.0)
MCHC: 33.5 g/dL (ref 30.0–36.0)
MCV: 86.6 fL (ref 80.0–100.0)
Platelets: 146 10*3/uL — ABNORMAL LOW (ref 150–400)
RBC: 4.34 MIL/uL (ref 4.22–5.81)
RDW: 16.5 % — ABNORMAL HIGH (ref 11.5–15.5)
WBC: 8 10*3/uL (ref 4.0–10.5)
nRBC: 0 % (ref 0.0–0.2)

## 2020-09-19 LAB — MAGNESIUM: Magnesium: 1.7 mg/dL (ref 1.7–2.4)

## 2020-09-20 LAB — PHOSPHORUS: Phosphorus: 3.8 mg/dL (ref 2.5–4.6)

## 2020-09-20 LAB — MAGNESIUM: Magnesium: 1.8 mg/dL (ref 1.7–2.4)

## 2020-09-21 ENCOUNTER — Other Ambulatory Visit (HOSPITAL_COMMUNITY): Payer: BC Managed Care – PPO

## 2020-09-21 LAB — BASIC METABOLIC PANEL
Anion gap: 10 (ref 5–15)
BUN: 10 mg/dL (ref 8–23)
CO2: 23 mmol/L (ref 22–32)
Calcium: 9 mg/dL (ref 8.9–10.3)
Chloride: 103 mmol/L (ref 98–111)
Creatinine, Ser: 0.47 mg/dL — ABNORMAL LOW (ref 0.61–1.24)
GFR, Estimated: >60 mL/min (ref >60)
Glucose, Bld: 93 mg/dL (ref 70–99)
Potassium: 3.6 mmol/L (ref 3.5–5.1)
Sodium: 136 mmol/L (ref 135–145)

## 2020-09-21 LAB — MAGNESIUM: Magnesium: 1.7 mg/dL (ref 1.7–2.4)

## 2020-09-22 LAB — RENAL FUNCTION PANEL
Albumin: 2.7 g/dL — ABNORMAL LOW (ref 3.5–5.0)
Anion gap: 8 (ref 5–15)
BUN: 12 mg/dL (ref 8–23)
CO2: 23 mmol/L (ref 22–32)
Calcium: 8.7 mg/dL — ABNORMAL LOW (ref 8.9–10.3)
Chloride: 103 mmol/L (ref 98–111)
Creatinine, Ser: 0.38 mg/dL — ABNORMAL LOW (ref 0.61–1.24)
GFR, Estimated: >60 mL/min (ref >60)
Glucose, Bld: 94 mg/dL (ref 70–99)
Phosphorus: 3.1 mg/dL (ref 2.5–4.6)
Potassium: 3.7 mmol/L (ref 3.5–5.1)
Sodium: 134 mmol/L — ABNORMAL LOW (ref 135–145)

## 2020-09-22 LAB — CBC
HCT: 37.4 % — ABNORMAL LOW (ref 39.0–52.0)
Hemoglobin: 12.1 g/dL — ABNORMAL LOW (ref 13.0–17.0)
MCH: 28 pg (ref 26.0–34.0)
MCHC: 32.4 g/dL (ref 30.0–36.0)
MCV: 86.6 fL (ref 80.0–100.0)
Platelets: 128 10*3/uL — ABNORMAL LOW (ref 150–400)
RBC: 4.32 MIL/uL (ref 4.22–5.81)
RDW: 16.8 % — ABNORMAL HIGH (ref 11.5–15.5)
WBC: 7.6 10*3/uL (ref 4.0–10.5)
nRBC: 0 % (ref 0.0–0.2)

## 2020-09-22 LAB — MAGNESIUM: Magnesium: 1.7 mg/dL (ref 1.7–2.4)

## 2020-09-23 ENCOUNTER — Other Ambulatory Visit (HOSPITAL_COMMUNITY): Payer: BC Managed Care – PPO

## 2020-09-23 LAB — MAGNESIUM: Magnesium: 1.7 mg/dL (ref 1.7–2.4)

## 2020-09-23 LAB — POTASSIUM: Potassium: 3.6 mmol/L (ref 3.5–5.1)

## 2020-09-23 LAB — PHOSPHORUS: Phosphorus: 3 mg/dL (ref 2.5–4.6)

## 2020-09-24 LAB — MAGNESIUM: Magnesium: 1.6 mg/dL — ABNORMAL LOW (ref 1.7–2.4)

## 2020-09-24 LAB — CBC
HCT: 36.4 % — ABNORMAL LOW (ref 39.0–52.0)
Hemoglobin: 12.4 g/dL — ABNORMAL LOW (ref 13.0–17.0)
MCH: 29.1 pg (ref 26.0–34.0)
MCHC: 34.1 g/dL (ref 30.0–36.0)
MCV: 85.4 fL (ref 80.0–100.0)
Platelets: 129 10*3/uL — ABNORMAL LOW (ref 150–400)
RBC: 4.26 MIL/uL (ref 4.22–5.81)
RDW: 16.8 % — ABNORMAL HIGH (ref 11.5–15.5)
WBC: 7.5 10*3/uL (ref 4.0–10.5)
nRBC: 0 % (ref 0.0–0.2)

## 2020-09-24 LAB — BASIC METABOLIC PANEL
Anion gap: 8 (ref 5–15)
BUN: 10 mg/dL (ref 8–23)
CO2: 23 mmol/L (ref 22–32)
Calcium: 8.7 mg/dL — ABNORMAL LOW (ref 8.9–10.3)
Chloride: 104 mmol/L (ref 98–111)
Creatinine, Ser: 0.47 mg/dL — ABNORMAL LOW (ref 0.61–1.24)
GFR, Estimated: >60 mL/min (ref >60)
Glucose, Bld: 124 mg/dL — ABNORMAL HIGH (ref 70–99)
Potassium: 3.4 mmol/L — ABNORMAL LOW (ref 3.5–5.1)
Sodium: 135 mmol/L (ref 135–145)

## 2020-09-24 LAB — PHOSPHORUS: Phosphorus: 3.6 mg/dL (ref 2.5–4.6)

## 2020-09-24 LAB — TRIGLYCERIDES: Triglycerides: 95 mg/dL (ref <150)

## 2020-09-25 ENCOUNTER — Other Ambulatory Visit (HOSPITAL_COMMUNITY): Payer: BC Managed Care – PPO

## 2020-09-25 LAB — BASIC METABOLIC PANEL
Anion gap: 8 (ref 5–15)
BUN: 11 mg/dL (ref 8–23)
CO2: 24 mmol/L (ref 22–32)
Calcium: 8.9 mg/dL (ref 8.9–10.3)
Chloride: 104 mmol/L (ref 98–111)
Creatinine, Ser: 0.47 mg/dL — ABNORMAL LOW (ref 0.61–1.24)
GFR, Estimated: >60 mL/min (ref >60)
Glucose, Bld: 111 mg/dL — ABNORMAL HIGH (ref 70–99)
Potassium: 3.5 mmol/L (ref 3.5–5.1)
Sodium: 136 mmol/L (ref 135–145)

## 2020-09-25 LAB — MAGNESIUM: Magnesium: 1.8 mg/dL (ref 1.7–2.4)

## 2020-09-26 ENCOUNTER — Other Ambulatory Visit (HOSPITAL_COMMUNITY): Payer: BC Managed Care – PPO

## 2020-09-26 LAB — BASIC METABOLIC PANEL
Anion gap: 9 (ref 5–15)
BUN: 13 mg/dL (ref 8–23)
CO2: 24 mmol/L (ref 22–32)
Calcium: 9 mg/dL (ref 8.9–10.3)
Chloride: 103 mmol/L (ref 98–111)
Creatinine, Ser: 0.51 mg/dL — ABNORMAL LOW (ref 0.61–1.24)
GFR, Estimated: >60 mL/min (ref >60)
Glucose, Bld: 104 mg/dL — ABNORMAL HIGH (ref 70–99)
Potassium: 3.4 mmol/L — ABNORMAL LOW (ref 3.5–5.1)
Sodium: 136 mmol/L (ref 135–145)

## 2020-09-26 LAB — CBC
HCT: 37.5 % — ABNORMAL LOW (ref 39.0–52.0)
Hemoglobin: 12.2 g/dL — ABNORMAL LOW (ref 13.0–17.0)
MCH: 28.6 pg (ref 26.0–34.0)
MCHC: 32.5 g/dL (ref 30.0–36.0)
MCV: 88 fL (ref 80.0–100.0)
Platelets: 142 10*3/uL — ABNORMAL LOW (ref 150–400)
RBC: 4.26 MIL/uL (ref 4.22–5.81)
RDW: 16.9 % — ABNORMAL HIGH (ref 11.5–15.5)
WBC: 7.5 10*3/uL (ref 4.0–10.5)
nRBC: 0 % (ref 0.0–0.2)

## 2020-09-26 LAB — MAGNESIUM: Magnesium: 1.7 mg/dL (ref 1.7–2.4)

## 2020-09-26 LAB — PHOSPHORUS: Phosphorus: 3.8 mg/dL (ref 2.5–4.6)

## 2020-09-27 LAB — BASIC METABOLIC PANEL
Anion gap: 11 (ref 5–15)
BUN: 12 mg/dL (ref 8–23)
CO2: 23 mmol/L (ref 22–32)
Calcium: 8.6 mg/dL — ABNORMAL LOW (ref 8.9–10.3)
Chloride: 103 mmol/L (ref 98–111)
Creatinine, Ser: 0.46 mg/dL — ABNORMAL LOW (ref 0.61–1.24)
GFR, Estimated: >60 mL/min (ref >60)
Glucose, Bld: 106 mg/dL — ABNORMAL HIGH (ref 70–99)
Potassium: 3.4 mmol/L — ABNORMAL LOW (ref 3.5–5.1)
Sodium: 137 mmol/L (ref 135–145)

## 2020-09-27 LAB — PHOSPHORUS: Phosphorus: 4.2 mg/dL (ref 2.5–4.6)

## 2020-09-27 LAB — MAGNESIUM: Magnesium: 1.8 mg/dL (ref 1.7–2.4)

## 2020-09-28 ENCOUNTER — Other Ambulatory Visit (HOSPITAL_COMMUNITY): Payer: BC Managed Care – PPO

## 2020-09-28 LAB — BASIC METABOLIC PANEL
Anion gap: 8 (ref 5–15)
BUN: 13 mg/dL (ref 8–23)
CO2: 23 mmol/L (ref 22–32)
Calcium: 8.9 mg/dL (ref 8.9–10.3)
Chloride: 106 mmol/L (ref 98–111)
Creatinine, Ser: 0.39 mg/dL — ABNORMAL LOW (ref 0.61–1.24)
GFR, Estimated: >60 mL/min (ref >60)
Glucose, Bld: 112 mg/dL — ABNORMAL HIGH (ref 70–99)
Potassium: 4 mmol/L (ref 3.5–5.1)
Sodium: 137 mmol/L (ref 135–145)

## 2020-09-28 LAB — CBC
HCT: 38.1 % — ABNORMAL LOW (ref 39.0–52.0)
Hemoglobin: 12.1 g/dL — ABNORMAL LOW (ref 13.0–17.0)
MCH: 28 pg (ref 26.0–34.0)
MCHC: 31.8 g/dL (ref 30.0–36.0)
MCV: 88.2 fL (ref 80.0–100.0)
Platelets: 158 10*3/uL (ref 150–400)
RBC: 4.32 MIL/uL (ref 4.22–5.81)
RDW: 17.1 % — ABNORMAL HIGH (ref 11.5–15.5)
WBC: 5.9 10*3/uL (ref 4.0–10.5)
nRBC: 0 % (ref 0.0–0.2)

## 2020-09-28 LAB — MAGNESIUM: Magnesium: 1.8 mg/dL (ref 1.7–2.4)

## 2020-09-28 LAB — PHOSPHORUS: Phosphorus: 4.4 mg/dL (ref 2.5–4.6)

## 2020-09-29 ENCOUNTER — Inpatient Hospital Stay
Admission: RE | Admit: 2020-09-29 | Discharge: 2020-10-30 | Disposition: A | Discharge status: Home / Self Care | Payer: BC Managed Care – PPO | Attending: Internal Medicine | Admitting: Internal Medicine

## 2020-09-29 ENCOUNTER — Emergency Department (HOSPITAL_COMMUNITY): Payer: BC Managed Care – PPO

## 2020-09-29 ENCOUNTER — Emergency Department (HOSPITAL_COMMUNITY)
Admission: EM | Admit: 2020-09-29 | Discharge: 2020-09-29 | Disposition: A | Discharge status: Home / Self Care | Payer: BC Managed Care – PPO | Source: Other Acute Inpatient Hospital | Attending: Emergency Medicine | Admitting: Emergency Medicine

## 2020-09-29 ENCOUNTER — Other Ambulatory Visit: Payer: Self-pay

## 2020-09-29 ENCOUNTER — Other Ambulatory Visit (HOSPITAL_COMMUNITY): Payer: BC Managed Care – PPO

## 2020-09-29 DIAGNOSIS — Z8616 Personal history of COVID-19: Secondary | ICD-10-CM | POA: Insufficient documentation

## 2020-09-29 DIAGNOSIS — Z4682 Encounter for fitting and adjustment of non-vascular catheter: Secondary | ICD-10-CM

## 2020-09-29 DIAGNOSIS — K567 Ileus, unspecified: Secondary | ICD-10-CM

## 2020-09-29 DIAGNOSIS — J939 Pneumothorax, unspecified: Secondary | ICD-10-CM | POA: Diagnosis present

## 2020-09-29 DIAGNOSIS — J189 Pneumonia, unspecified organism: Secondary | ICD-10-CM

## 2020-09-29 DIAGNOSIS — U071 COVID-19: Secondary | ICD-10-CM | POA: Diagnosis present

## 2020-09-29 DIAGNOSIS — J80 Acute respiratory distress syndrome: Secondary | ICD-10-CM | POA: Diagnosis present

## 2020-09-29 DIAGNOSIS — J9621 Acute and chronic respiratory failure with hypoxia: Secondary | ICD-10-CM | POA: Diagnosis present

## 2020-09-29 DIAGNOSIS — J942 Hemothorax: Secondary | ICD-10-CM

## 2020-09-29 LAB — BLOOD GAS, ARTERIAL
Acid-Base Excess: 0.4 mmol/L (ref 0.0–2.0)
Bicarbonate: 23.8 mmol/L (ref 20.0–28.0)
FIO2: 44
O2 Saturation: 94.6 %
Patient temperature: 36.9
pCO2 arterial: 33.3 mmHg (ref 32.0–48.0)
pH, Arterial: 7.468 — ABNORMAL HIGH (ref 7.350–7.450)
pO2, Arterial: 65 mmHg — ABNORMAL LOW (ref 83.0–108.0)

## 2020-09-29 LAB — PHOSPHORUS: Phosphorus: 4.1 mg/dL (ref 2.5–4.6)

## 2020-09-29 LAB — MAGNESIUM: Magnesium: 1.6 mg/dL — ABNORMAL LOW (ref 1.7–2.4)

## 2020-09-29 MED ORDER — HYDROMORPHONE HCL 1 MG/ML IJ SOLN
2.0000 mg | Freq: Once | INTRAMUSCULAR | Status: AC
  Administered 2020-09-29: 2 mg via INTRAMUSCULAR
  Filled 2020-09-29: qty 2

## 2020-09-29 MED ORDER — HYDROCODONE-ACETAMINOPHEN 5-325 MG PO TABS: 2.0000 | ORAL_TABLET | Freq: Once | ORAL | Status: DC

## 2020-09-29 MED ORDER — LIDOCAINE-EPINEPHRINE 1 %-1:100000 IJ SOLN
10.0000 mL | Freq: Once | INTRAMUSCULAR | Status: DC
  Filled 2020-09-29: qty 1

## 2020-09-29 NOTE — ED Provider Notes (Signed)
[facility] EMERGENCY DEPARTMENT Provider Note   CSN: [id number] Arrival date & time: 09/29/20  1731     History Chief Complaint  Patient presents with  . Shortness of Breath    Here for chest tube insertion     Alvis Edgell is a 64 y.o. male.  HPI 64 year old [facility]-sided pneumothorax. Was found to have a pneumothorax on chest x-ray earlier today and is requiring more oxygen than typical. Started to have a cough today. Otherwise no acute distress.   Past Medical History:  Diagnosis Date  . Acute on chronic respiratory failure with hypoxia (HCC)   . Acute respiratory distress syndrome (ARDS) due to COVID-19 virus (HCC)   . COVID-19 virus infection   . Pneumonia due to COVID-19 virus     Patient Active Problem List   Diagnosis Date Noted  . Acute on chronic respiratory failure with hypoxia (HCC)   . COVID-19 virus infection   . Pneumonia due to COVID-19 virus   . Acute respiratory distress syndrome (ARDS) due to COVID-19 virus [facility])          No family history on file.     Home Medications Prior to Admission medications   Not on File    Allergies    Patient has no known allergies.  Review of Systems   Review of Systems  Respiratory: Positive for cough and shortness of breath.   All other systems reviewed and are negative.   Physical Exam Updated Vital Signs BP 104/64   Pulse (!) 102   Temp 98.5 F (36.9 C) (Oral)   Resp (!) 39   SpO2 93%   Physical Exam Vitals and nursing note reviewed.  Constitutional:      Appearance: He is well-developed and well-nourished.  HENT:     Head: Normocephalic and atraumatic.     Right Ear: External ear normal.     Left Ear: External ear normal.     Nose: Nose normal.  Eyes:     General:        Right eye: No discharge.        Left eye: No discharge.  Cardiovascular:     Rate and Rhythm: Regular rhythm. Tachycardia present.     Heart sounds:  Normal heart sounds.  Pulmonary:     Effort: Pulmonary effort is normal.     Breath sounds: Normal breath sounds.  Abdominal:     Palpations: Abdomen is soft.     Tenderness: There is no abdominal tenderness.  Musculoskeletal:        General: No edema.     Cervical back: Neck supple.  Skin:    General: Skin is warm and dry.  Neurological:     Mental Status: He is alert.  Psychiatric:        Mood and Affect: Mood is not anxious.     ED Results / Procedures / Treatments   Labs (all labs ordered are listed, but only abnormal results are displayed) Labs Reviewed - No data to display  EKG None  Radiology DG Chest Portable 1 View  Result Date: 09/29/2020 CLINICAL DATA:  Chest tube EXAM: PORTABLE CHEST 1 VIEW COMPARISON:  09/29/2020 FINDINGS: Interval placement of right chest tube. Re-expansion of the right lung. Diffuse airspace disease throughout the lungs. Heart is borderline in size. NG tube is in the stomach. IMPRESSION: Interval placement of right chest tube and re-expansion of the right lung. No pneumothorax. Stable diffuse bilateral  airspace disease. Electronically Signed   By: Charlett Nose M.D.   On: 09/29/2020 19:07   DG CHEST PORT 1 VIEW  Result Date: 09/29/2020 CLINICAL DATA:  Increased oxygen demand. EXAM: PORTABLE CHEST 1 VIEW COMPARISON:  Radiograph 09/07/2020 FINDINGS: Right pneumothorax is new from prior exam, moderate in size, approximately 30-40%. No definite leftward mediastinal shift. Enteric tube in left upper extremity PICC remain in place. Diffuse bilateral lung opacities have increased on the right likely due to volume loss from pneumothorax. There is slight worsening opacity at the left lung base. No left pneumothorax. No large pleural effusion. IMPRESSION: 1. New right pneumothorax, moderate in size, approximately 30-40%. No definite mediastinal shift. 2. Diffuse bilateral lung opacities which have progressed since imaging last month. Critical Value/emergent  results were called by telephone at the time of interpretation on 09/29/2020 at 4:07 pm to provider CHUN LI , who verbally acknowledged these results. Electronically Signed   By: Narda Rutherford M.D.   On: 09/29/2020 16:07   DG Abd Portable 1V  Result Date: 09/28/2020 CLINICAL DATA:  Evaluate Persistent mild diffuse ileus bowel gas pattern. EXAM: PORTABLE ABDOMEN - 1 VIEW COMPARISON:  09/26/2020 FINDINGS: NG tube extends the stomach. No dilated large or small bowel. Degenerate spurring of the spine. IMPRESSION: No interval change.  NG tube in stomach. Electronically Signed   By: Genevive Bi M.D.   On: 09/28/2020 06:27    Procedures CHEST TUBE INSERTION  Date/Time: 09/29/2020 6:17 PM Performed by: Pricilla Loveless, MD Authorized by: Pricilla Loveless, MD   Consent:    Consent obtained:  Written   Consent given by:  Patient   Risks, benefits, and alternatives were discussed: yes     Risks discussed:  Bleeding, damage to surrounding structures, infection, incomplete drainage, nerve damage and pain Universal protocol:    Patient identity confirmed:  Verbally with patient Pre-procedure details:    Skin preparation:  Povidone-iodine   Preparation: Patient was prepped and draped in the usual sterile fashion   Sedation:    Sedation type:  None Anesthesia:    Anesthesia method:  Local infiltration   Local anesthetic:  Lidocaine 1% w/o epi Procedure details:    Placement location:  R lateral   Tube size (Fr):  Minicatheter   Tension pneumothorax: no     Tube connected to:  Suction   Drainage characteristics:  Air only   Suture material:  0 silk   Dressing:  4x4 sterile gauze Post-procedure details:    Post-insertion x-ray findings: tube in good position     Procedure completion:  Tolerated well, no immediate complications .Critical Care Performed by: Pricilla Loveless, MD Authorized by: Pricilla Loveless, MD   Critical care provider statement:    Critical care time (minutes):  30    Critical care time was exclusive of:  Separately billable procedures and treating other patients   Critical care was necessary to treat or prevent imminent or life-threatening deterioration of the following conditions:  Respiratory failure   Critical care was time spent personally by me on the following activities:  Discussions with consultants, evaluation of patient's response to treatment, examination of patient, ordering and performing treatments and interventions, ordering and review of laboratory studies, ordering and review of radiographic studies, pulse oximetry, re-evaluation of patient's condition, obtaining history from patient or surrogate and review of old charts   (including critical care time)  Medications Ordered in ED Medications  lidocaine-EPINEPHrine (XYLOCAINE W/EPI) 1 %-1:100000 (with pres) injection 10 mL (10 mLs Infiltration Not  Given 09/29/20 1829)  HYDROmorphone (DILAUDID) injection 2 mg (2 mg Intramuscular Given 09/29/20 1827)    ED Course  I have reviewed the triage vital signs and the nursing notes.  Pertinent labs & imaging results that were available during my care of the patient were reviewed by me and considered in my medical decision making (see chart for details).    MDM Rules/Calculators/A&P                          [facility]-sided pneumothorax.  He is not in distress but is requiring more oxygen than typical.  After consent, pigtail catheter placed as above.  Has resolution of pneumothorax.  Will send back to facility. Final Clinical Impression(s) / ED Diagnoses Final diagnoses:  Pneumothorax on right    Rx / DC Orders ED Discharge Orders    None       Pricilla Loveless, MD 09/29/20 1918

## 2020-09-29 NOTE — ED Notes (Signed)
Consents signed

## 2020-09-29 NOTE — ED Notes (Signed)
[facility].

## 2020-09-29 NOTE — ED Notes (Signed)
Report given to [facility].  Transported by staff.   VSS  Discharge info provided to staff.

## 2020-09-29 NOTE — ED Notes (Signed)
[name], TPN running, 5L [place], L PICC, all present upon arrival.

## 2020-09-29 NOTE — ED Notes (Signed)
Patient sent here from [facility] for placement of R chest tube.   Patient has R pneumothorax.   Patient has extensive hx.   Timeout 1800- R pleural chest tube placed by dr Criss Alvine- 14 fr  Patient tolerated well, XR confirmed.  Set to low continuous suction.

## 2020-09-30 LAB — MAGNESIUM: Magnesium: 1.7 mg/dL (ref 1.7–2.4)

## 2020-10-01 ENCOUNTER — Other Ambulatory Visit (HOSPITAL_COMMUNITY): Payer: BC Managed Care – PPO

## 2020-10-01 LAB — CBC
HCT: 36.1 % — ABNORMAL LOW (ref 39.0–52.0)
Hemoglobin: 11.9 g/dL — ABNORMAL LOW (ref 13.0–17.0)
MCH: 29.3 pg (ref 26.0–34.0)
MCHC: 33 g/dL (ref 30.0–36.0)
MCV: 88.9 fL (ref 80.0–100.0)
Platelets: 182 10*3/uL (ref 150–400)
RBC: 4.06 MIL/uL — ABNORMAL LOW (ref 4.22–5.81)
RDW: 16.9 % — ABNORMAL HIGH (ref 11.5–15.5)
WBC: 7.2 10*3/uL (ref 4.0–10.5)
nRBC: 0 % (ref 0.0–0.2)

## 2020-10-01 LAB — BASIC METABOLIC PANEL
Anion gap: 8 (ref 5–15)
BUN: 11 mg/dL (ref 8–23)
CO2: 26 mmol/L (ref 22–32)
Calcium: 9 mg/dL (ref 8.9–10.3)
Chloride: 102 mmol/L (ref 98–111)
Creatinine, Ser: 0.48 mg/dL — ABNORMAL LOW (ref 0.61–1.24)
GFR, Estimated: >60 mL/min (ref >60)
Glucose, Bld: 119 mg/dL — ABNORMAL HIGH (ref 70–99)
Potassium: 4 mmol/L (ref 3.5–5.1)
Sodium: 136 mmol/L (ref 135–145)

## 2020-10-01 LAB — MAGNESIUM: Magnesium: 1.6 mg/dL — ABNORMAL LOW (ref 1.7–2.4)

## 2020-10-02 ENCOUNTER — Other Ambulatory Visit (HOSPITAL_COMMUNITY): Payer: BC Managed Care – PPO

## 2020-10-02 DIAGNOSIS — U071 COVID-19: Secondary | ICD-10-CM | POA: Diagnosis not present

## 2020-10-02 DIAGNOSIS — J939 Pneumothorax, unspecified: Secondary | ICD-10-CM | POA: Diagnosis not present

## 2020-10-02 DIAGNOSIS — J80 Acute respiratory distress syndrome: Secondary | ICD-10-CM | POA: Diagnosis not present

## 2020-10-02 DIAGNOSIS — J9621 Acute and chronic respiratory failure with hypoxia: Secondary | ICD-10-CM | POA: Diagnosis not present

## 2020-10-02 LAB — MAGNESIUM: Magnesium: 1.8 mg/dL (ref 1.7–2.4)

## 2020-10-03 ENCOUNTER — Other Ambulatory Visit (HOSPITAL_COMMUNITY): Payer: BC Managed Care – PPO

## 2020-10-03 ENCOUNTER — Encounter: Payer: Self-pay | Admitting: Internal Medicine

## 2020-10-03 DIAGNOSIS — J939 Pneumothorax, unspecified: Secondary | ICD-10-CM | POA: Diagnosis not present

## 2020-10-03 DIAGNOSIS — U071 COVID-19: Secondary | ICD-10-CM | POA: Diagnosis not present

## 2020-10-03 DIAGNOSIS — J9621 Acute and chronic respiratory failure with hypoxia: Secondary | ICD-10-CM | POA: Diagnosis not present

## 2020-10-03 DIAGNOSIS — J80 Acute respiratory distress syndrome: Secondary | ICD-10-CM

## 2020-10-03 HISTORY — DX: Pneumothorax, unspecified: J93.9

## 2020-10-03 LAB — BASIC METABOLIC PANEL
Anion gap: 13 (ref 5–15)
BUN: 16 mg/dL (ref 8–23)
CO2: 25 mmol/L (ref 22–32)
Calcium: 9.3 mg/dL (ref 8.9–10.3)
Chloride: 100 mmol/L (ref 98–111)
Creatinine, Ser: 0.61 mg/dL (ref 0.61–1.24)
GFR, Estimated: >60 mL/min (ref >60)
Glucose, Bld: 110 mg/dL — ABNORMAL HIGH (ref 70–99)
Potassium: 4.1 mmol/L (ref 3.5–5.1)
Sodium: 138 mmol/L (ref 135–145)

## 2020-10-03 NOTE — Consult Note (Signed)
Pulmonary Critical Care Medicine [facility] [place]   PULMONARY CRITICAL CARE SERVICE  PROGRESS NOTE  Date of Service: 10/02/2020  [name]  [id number]  DOB: [birth year]   DOA: 09/29/2020  Referring Physician: Carron Curie, MD  HPI: Edward Andrade is a 64 y.o. male seen for follow up of Acute on Chronic Respiratory Failure.  Asked to see the patient once again because of development of pneumothorax.  He developed a spontaneous pneumothorax had a chest tube placed by interventional radiology now appears to be doing better.  Patient's oxygen requirements are also better.  Medications: Reviewed on Rounds  Physical Exam:  Vitals: Temperature is 98.6 pulse one oh three respiratory rate is thirty blood pressure 118/73 saturations 96%  Ventilator Settings on oxygen therapy appears to be comfortable  . General: Comfortable at this time . Eyes: Grossly normal lids, irises & conjunctiva . ENT: grossly tongue is normal . Neck: no obvious mass . Cardiovascular: S1 S2 normal no gallop . Respiratory: No rhonchi rales are noted at this time. . Abdomen: soft . Skin: no rash seen on limited exam . Musculoskeletal: not rigid . Psychiatric:unable to assess . Neurologic: no seizure no involuntary movements           ABG: Recent Labs  Lab 09/29/20 1552  PHART 7.468*  PCO2ART 33.3  PO2ART 65.0*  HCO3 23.8  O2SAT 94.6    Liver Function Tests:  No results for input(s): LIPASE, AMYLASE in the last 168 hours. No results for input(s): AMMONIA in the last 168 hours.  Cardiac Enzymes: No results for input(s): CKTOTAL, CKMB, CKMBINDEX, TROPONINI in the last 168 hours.  BNP (last 3 results) No results for input(s): BNP in the last 8760 hours.  ProBNP (last 3 results) No results for input(s): PROBNP in the last 8760 hours.  Radiological Exams: Reviewed  Assessment/Plan Active Problems:   Acute on chronic respiratory failure with hypoxia (HCC)   COVID-19  virus infection   Acute respiratory distress syndrome (ARDS) due to COVID-19 virus (HCC)   Pneumothorax on right   1. Acute on chronic respiratory failure with hypoxia patient is doing better after chest tube placement.  Plan is going to be to monitor for leak.  Titrate oxygen as warranted. 2. COVID-19 virus infection recovery 3. Treated improving 4. Pneumothorax status post chest tube   I have personally seen and evaluated the patient, evaluated laboratory and imaging results, formulated the assessment and plan and placed orders. The Patient requires high complexity decision making with multiple systems involvement.  Rounds were done with the Respiratory Therapy Director and Staff therapists and discussed with nursing staff also.  Yevonne Pax, MD [facility] Pulmonary Critical Care Medicine Sleep Medicine

## 2020-10-03 NOTE — Progress Notes (Signed)
Pulmonary Critical Care Medicine [facility] [place]   PULMONARY CRITICAL CARE SERVICE  PROGRESS NOTE  Date of Service: 10/03/2020  Edward Andrade  KGM:[id number]  DOB: [birth year]   DOA: 09/29/2020  Referring Physician: Carron Curie, MD  HPI: Edward Andrade is a 64 y.o. male seen for follow up of Acute on Chronic Respiratory Failure.  Patient is currently on nasal cannula with Oxymizer requiring 12 L.  Still waxing and waning as far saturations are concerned.  Respiratory therapy noted easy drop in saturations with any kind of movement.  Chest x-ray reveals no pneumothorax at this time however there is still significant increase in pulmonary vascular congestion  Medications: Reviewed on Rounds  Physical Exam:  Vitals: Temperature is 96.9 pulse 72 respiratory rate is 18 blood pressure is 137/78 saturations 96%  Ventilator Settings on 12 L Oxymizer  . General: Comfortable at this time . Eyes: Grossly normal lids, irises & conjunctiva . ENT: grossly tongue is normal . Neck: no obvious mass . Cardiovascular: S1 S2 normal no gallop . Respiratory: No rhonchi no rales noted . Abdomen: soft . Skin: no rash seen on limited exam . Musculoskeletal: not rigid . Psychiatric:unable to assess . Neurologic: no seizure no involuntary movements         Lab Data:   Basic Metabolic Panel: Recent Labs  Lab 09/27/20 0424 09/28/20 0542 09/29/20 0606 09/30/20 1217 10/01/20 0505 10/02/20 0451  NA 137 137  --   --  136  --   K 3.4* 4.0  --   --  4.0  --   CL 103 106  --   --  102  --   CO2 23 23  --   --  26  --   GLUCOSE 106* 112*  --   --  119*  --   BUN 12 13  --   --  11  --   CREATININE 0.46* 0.39*  --   --  0.48*  --   CALCIUM 8.6* 8.9  --   --  9.0  --   MG 1.8 1.8 1.6* 1.7 1.6* 1.8  PHOS 4.2 4.4 4.1  --   --   --     ABG: Recent Labs  Lab 09/29/20 1552  PHART 7.468*  PCO2ART 33.3  PO2ART 65.0*  HCO3 23.8  O2SAT 94.6    Liver Function Tests: No results  for input(s): AST, ALT, ALKPHOS, BILITOT, PROT, ALBUMIN in the last 168 hours. No results for input(s): LIPASE, AMYLASE in the last 168 hours. No results for input(s): AMMONIA in the last 168 hours.  CBC: Recent Labs  Lab 09/28/20 0542 10/01/20 0505  WBC 5.9 7.2  HGB 12.1* 11.9*  HCT 38.1* 36.1*  MCV 88.2 88.9  PLT 158 182    Cardiac Enzymes: No results for input(s): CKTOTAL, CKMB, CKMBINDEX, TROPONINI in the last 168 hours.  BNP (last 3 results) No results for input(s): BNP in the last 8760 hours.  ProBNP (last 3 results) No results for input(s): PROBNP in the last 8760 hours.  Radiological Exams: DG Chest Port 1 View  Result Date: 10/03/2020 CLINICAL DATA:  Pneumothorax. EXAM: PORTABLE CHEST 1 VIEW COMPARISON:  10/02/2020. FINDINGS: 0610 hours. Low lung volumes with lordotic positioning. The cardio pericardial silhouette is enlarged. Diffuse interstitial lung disease is similar. Right percutaneous pleural catheter is stable in position. No evidence for pneumothorax or substantial pleural effusion. Left PICC line tip projects over the upper right atrium. The NG tube passes into the stomach  although the distal tip position is not included on the film. Telemetry leads overlie the chest. IMPRESSION: 1. Stable exam. Diffuse interstitial lung disease. 2. No evidence for pneumothorax or substantial pleural effusion. Electronically Signed   By: Kennith Center M.D.   On: 10/03/2020 06:44   DG CHEST PORT 1 VIEW  Result Date: 10/02/2020 CLINICAL DATA:  64 year old male COVID-19.  Pneumothorax. EXAM: PORTABLE CHEST 1 VIEW COMPARISON:  Portable chest 10/01/2020 and earlier. FINDINGS: Portable AP semi upright view at 0558 hours. No endotracheal tube. Stable left upper extremity approach PICC line. Enteric tube courses to the stomach, side hole the level of the gastric body. Stable pigtail right chest tube. No pneumothorax identified. Coarse and confluent bilateral pulmonary interstitial opacity  appears stable since 09/29/2020. Stable cardiac size and mediastinal contours. Negative visible bowel gas in the upper abdomen. Stable visualized osseous structures. IMPRESSION: 1. Stable right chest tube. No pneumothorax identified. 2. Stable bilateral COVID-19 pneumonia since 09/29/2020. Electronically Signed   By: Odessa Fleming M.D.   On: 10/02/2020 06:29   DG Abd Portable 1V  Result Date: 10/02/2020 CLINICAL DATA:  64 year old male COVID-19.  Ileus. EXAM: PORTABLE ABDOMEN - 1 VIEW COMPARISON:  Abdominal radiographs 09/28/2020 and earlier. FINDINGS: Portable AP supine view at 0603 hours. Enteric tube with side hole at the level of the gastric body better demonstrated on contemporary portable chest. Bowel-gas pattern appears improved since 09/26/2020 and relatively normal now. Minimal gaseous distension of the left colon and some right abdominal small bowel. Gas is present in the rectosigmoid colon. Stable visualized osseous structures. IMPRESSION: 1. Improving bowel gas pattern since 09/26/20, approaching normal. 2. Enteric tube better visualized on portable chest this morning. Electronically Signed   By: Odessa Fleming M.D.   On: 10/02/2020 06:30    Assessment/Plan Active Problems:   Acute on chronic respiratory failure with hypoxia (HCC)   COVID-19 virus infection   Acute respiratory distress syndrome (ARDS) due to COVID-19 virus (HCC)   Pneumothorax on right   1. Acute on chronic respiratory failure hypoxia patient still requiring Oxymizer at 12 L plan is going to be to try to wean FiO2 down.  In addition patient does have what appears to be some fluid overload may benefit from diuresis. 2. COVID-19 virus infection in recovery we will continue with supportive care 3. ARDS due to COVID-19 improving still with some residual deficits noted on the chest film. 4. Pneumothorax on right status post chest tube placement with total reexpansion of the lung we will continue to monitor.   I have personally seen  and evaluated the patient, evaluated laboratory and imaging results, formulated the assessment and plan and placed orders. The Patient requires high complexity decision making with multiple systems involvement.  Rounds were done with the Respiratory Therapy Director and Staff therapists and discussed with nursing staff also.  Yevonne Pax, MD [facility] Pulmonary Critical Care Medicine Sleep Medicine

## 2020-10-04 ENCOUNTER — Other Ambulatory Visit (HOSPITAL_COMMUNITY): Payer: BC Managed Care – PPO

## 2020-10-04 DIAGNOSIS — J939 Pneumothorax, unspecified: Secondary | ICD-10-CM | POA: Diagnosis not present

## 2020-10-04 DIAGNOSIS — U071 COVID-19: Secondary | ICD-10-CM | POA: Diagnosis not present

## 2020-10-04 DIAGNOSIS — J80 Acute respiratory distress syndrome: Secondary | ICD-10-CM | POA: Diagnosis not present

## 2020-10-04 DIAGNOSIS — J9621 Acute and chronic respiratory failure with hypoxia: Secondary | ICD-10-CM | POA: Diagnosis not present

## 2020-10-04 LAB — CBC
HCT: 41.3 % (ref 39.0–52.0)
Hemoglobin: 12.9 g/dL — ABNORMAL LOW (ref 13.0–17.0)
MCH: 28 pg (ref 26.0–34.0)
MCHC: 31.2 g/dL (ref 30.0–36.0)
MCV: 89.8 fL (ref 80.0–100.0)
Platelets: 267 10*3/uL (ref 150–400)
RBC: 4.6 MIL/uL (ref 4.22–5.81)
RDW: 16.6 % — ABNORMAL HIGH (ref 11.5–15.5)
WBC: 6.4 10*3/uL (ref 4.0–10.5)
nRBC: 0 % (ref 0.0–0.2)

## 2020-10-04 LAB — RENAL FUNCTION PANEL
Albumin: 2.8 g/dL — ABNORMAL LOW (ref 3.5–5.0)
Anion gap: 11 (ref 5–15)
BUN: 19 mg/dL (ref 8–23)
CO2: 27 mmol/L (ref 22–32)
Calcium: 9.3 mg/dL (ref 8.9–10.3)
Chloride: 102 mmol/L (ref 98–111)
Creatinine, Ser: 0.6 mg/dL — ABNORMAL LOW (ref 0.61–1.24)
GFR, Estimated: >60 mL/min (ref >60)
Glucose, Bld: 116 mg/dL — ABNORMAL HIGH (ref 70–99)
Phosphorus: 4 mg/dL (ref 2.5–4.6)
Potassium: 4.4 mmol/L (ref 3.5–5.1)
Sodium: 140 mmol/L (ref 135–145)

## 2020-10-04 NOTE — Progress Notes (Signed)
Pulmonary Critical Care Medicine [facility] [place]   PULMONARY CRITICAL CARE SERVICE  PROGRESS NOTE  Date of Service: 10/04/2020  Edward Andrade  QIH:[id number]  DOB: 04-Apr-1956   DOA: 09/29/2020  Referring Physician: Carron Curie, MD  HPI: Edward Andrade is a 64 y.o. male seen for follow up of Acute on Chronic Respiratory Failure.  Patient currently is on 4 L of oxygen via nasal cannula.  Good saturations are noted.  Medications: Reviewed on Rounds  Physical Exam:  Vitals: Temperature is 96.4 pulse 71 respiratory 13 blood pressure is 127/63 saturations 97%  Ventilator Settings on 4 L of oxygen nasal cannula  . General: Comfortable at this time . Eyes: Grossly normal lids, irises & conjunctiva . ENT: grossly tongue is normal . Neck: no obvious mass . Cardiovascular: S1 S2 normal no gallop . Respiratory: No rhonchi very coarse breath sound . Abdomen: soft . Skin: no rash seen on limited exam . Musculoskeletal: not rigid . Psychiatric:unable to assess . Neurologic: no seizure no involuntary movements         Lab Data:   Basic Metabolic Panel: Recent Labs  Lab 09/28/20 0542 09/29/20 0606 09/30/20 1217 10/01/20 0505 10/02/20 0451 10/03/20 1040  NA 137  --   --  136  --  138  K 4.0  --   --  4.0  --  4.1  CL 106  --   --  102  --  100  CO2 23  --   --  26  --  25  GLUCOSE 112*  --   --  119*  --  110*  BUN 13  --   --  11  --  16  CREATININE 0.39*  --   --  0.48*  --  0.61  CALCIUM 8.9  --   --  9.0  --  9.3  MG 1.8 1.6* 1.7 1.6* 1.8  --   PHOS 4.4 4.1  --   --   --   --     ABG: Recent Labs  Lab 09/29/20 1552  PHART 7.468*  PCO2ART 33.3  PO2ART 65.0*  HCO3 23.8  O2SAT 94.6    Liver Function Tests: No results for input(s): AST, ALT, ALKPHOS, BILITOT, PROT, ALBUMIN in the last 168 hours. No results for input(s): LIPASE, AMYLASE in the last 168 hours. No results for input(s): AMMONIA in the last 168 hours.  CBC: Recent Labs  Lab  09/28/20 0542 10/01/20 0505  WBC 5.9 7.2  HGB 12.1* 11.9*  HCT 38.1* 36.1*  MCV 88.2 88.9  PLT 158 182    Cardiac Enzymes: No results for input(s): CKTOTAL, CKMB, CKMBINDEX, TROPONINI in the last 168 hours.  BNP (last 3 results) No results for input(s): BNP in the last 8760 hours.  ProBNP (last 3 results) No results for input(s): PROBNP in the last 8760 hours.  Radiological Exams: DG Chest Port 1 View  Result Date: 10/04/2020 CLINICAL DATA:  64 year old male COVID-19.  Pneumothorax. EXAM: PORTABLE CHEST 1 VIEW COMPARISON:  Portable chest 10/03/2020 and earlier. FINDINGS: Portable AP semi upright view at 0629 hours. Stable pigtail right chest tube. Stable visible enteric tube. Stable left PICC line. Mildly improved lung volumes since yesterday morning. Coarse bilateral reticulonodular opacity also appears regressed on the right. No pneumothorax or pleural effusion identified. Stable cardiac size and mediastinal contours. Negative visible bowel gas pattern. IMPRESSION: 1. Stable right chest tube. No pneumothorax identified. 2. Bilateral COVID-19 pneumonia with mildly improved lung volumes and ventilation since  yesterday. Electronically Signed   By: Odessa Fleming M.D.   On: 10/04/2020 07:02   DG Chest Port 1 View  Result Date: 10/03/2020 CLINICAL DATA:  Pneumothorax. EXAM: PORTABLE CHEST 1 VIEW COMPARISON:  10/02/2020. FINDINGS: 0610 hours. Low lung volumes with lordotic positioning. The cardio pericardial silhouette is enlarged. Diffuse interstitial lung disease is similar. Right percutaneous pleural catheter is stable in position. No evidence for pneumothorax or substantial pleural effusion. Left PICC line tip projects over the upper right atrium. The NG tube passes into the stomach although the distal tip position is not included on the film. Telemetry leads overlie the chest. IMPRESSION: 1. Stable exam. Diffuse interstitial lung disease. 2. No evidence for pneumothorax or substantial pleural  effusion. Electronically Signed   By: Kennith Center M.D.   On: 10/03/2020 06:44    Assessment/Plan Active Problems:   Acute on chronic respiratory failure with hypoxia (HCC)   COVID-19 virus infection   Acute respiratory distress syndrome (ARDS) due to COVID-19 virus (HCC)   Pneumothorax on right   1. Acute on chronic respiratory failure hypoxia we will continue with oxygen therapy titrate as tolerated. 2. COVID-19 virus infection recovery 3. ARDS treated slowly improving 4. Pneumothorax resolved after chest tube placement.   I have personally seen and evaluated the patient, evaluated laboratory and imaging results, formulated the assessment and plan and placed orders. The Patient requires high complexity decision making with multiple systems involvement.  Rounds were done with the Respiratory Therapy Director and Staff therapists and discussed with nursing staff also.  Yevonne Pax, MD [facility] Pulmonary Critical Care Medicine Sleep Medicine

## 2020-10-05 ENCOUNTER — Other Ambulatory Visit (HOSPITAL_COMMUNITY): Payer: BC Managed Care – PPO

## 2020-10-05 DIAGNOSIS — J939 Pneumothorax, unspecified: Secondary | ICD-10-CM | POA: Diagnosis not present

## 2020-10-05 DIAGNOSIS — J9621 Acute and chronic respiratory failure with hypoxia: Secondary | ICD-10-CM | POA: Diagnosis not present

## 2020-10-05 DIAGNOSIS — U071 COVID-19: Secondary | ICD-10-CM | POA: Diagnosis not present

## 2020-10-05 DIAGNOSIS — J80 Acute respiratory distress syndrome: Secondary | ICD-10-CM | POA: Diagnosis not present

## 2020-10-05 NOTE — Progress Notes (Signed)
Pulmonary Critical Care Medicine [facility] [place]   PULMONARY CRITICAL CARE SERVICE  PROGRESS NOTE  Date of Service: 10/05/2020  Edward Andrade  VZS:[id number]  DOB: [birth year]   DOA: 09/29/2020  Referring Physician: Carron Curie, MD  HPI: Edward Andrade is a 64 y.o. male seen for follow up of Acute on Chronic Respiratory Failure.  Patient currently is on 2 L of oxygen good saturations are noted.  Chest tube looks good last chest x-ray shows reexpansion of the lung chest tube is ready for removal  Medications: Reviewed on Rounds  Physical Exam:  Vitals: Temperature is 97.0 pulse sixty-one respiratory twenty blood pressure is 126/70 saturations 98%  Ventilator Settings on 2 L of oxygen by nasal cannula  . General: Comfortable at this time . Eyes: Grossly normal lids, irises & conjunctiva . ENT: grossly tongue is normal . Neck: no obvious mass . Cardiovascular: S1 S2 normal no gallop . Respiratory: No rhonchi no rales . Abdomen: soft . Skin: no rash seen on limited exam . Musculoskeletal: not rigid . Psychiatric:unable to assess . Neurologic: no seizure no involuntary movements         Lab Data:   Basic Metabolic Panel: Recent Labs  Lab 09/29/20 0606 09/30/20 1217 10/01/20 0505 10/02/20 0451 10/03/20 1040 10/04/20 1413  NA  --   --  136  --  138 140  K  --   --  4.0  --  4.1 4.4  CL  --   --  102  --  100 102  CO2  --   --  26  --  25 27  GLUCOSE  --   --  119*  --  110* 116*  BUN  --   --  11  --  16 19  CREATININE  --   --  0.48*  --  0.61 0.60*  CALCIUM  --   --  9.0  --  9.3 9.3  MG 1.6* 1.7 1.6* 1.8  --   --   PHOS 4.1  --   --   --   --  4.0    ABG: Recent Labs  Lab 09/29/20 1552  PHART 7.468*  PCO2ART 33.3  PO2ART 65.0*  HCO3 23.8  O2SAT 94.6    Liver Function Tests: Recent Labs  Lab 10/04/20 1413  ALBUMIN 2.8*   No results for input(s): LIPASE, AMYLASE in the last 168 hours. No results for input(s): AMMONIA in the last  168 hours.  CBC: Recent Labs  Lab 10/01/20 0505 10/04/20 1413  WBC 7.2 6.4  HGB 11.9* 12.9*  HCT 36.1* 41.3  MCV 88.9 89.8  PLT 182 267    Cardiac Enzymes: No results for input(s): CKTOTAL, CKMB, CKMBINDEX, TROPONINI in the last 168 hours.  BNP (last 3 results) No results for input(s): BNP in the last 8760 hours.  ProBNP (last 3 results) No results for input(s): PROBNP in the last 8760 hours.  Radiological Exams: DG Chest Port 1 View  Result Date: 10/04/2020 CLINICAL DATA:  64 year old male COVID-19.  Pneumothorax. EXAM: PORTABLE CHEST 1 VIEW COMPARISON:  Portable chest 10/03/2020 and earlier. FINDINGS: Portable AP semi upright view at 0629 hours. Stable pigtail right chest tube. Stable visible enteric tube. Stable left PICC line. Mildly improved lung volumes since yesterday morning. Coarse bilateral reticulonodular opacity also appears regressed on the right. No pneumothorax or pleural effusion identified. Stable cardiac size and mediastinal contours. Negative visible bowel gas pattern. IMPRESSION: 1. Stable right chest tube. No pneumothorax identified.  2. Bilateral COVID-19 pneumonia with mildly improved lung volumes and ventilation since yesterday. Electronically Signed   By: Odessa Fleming M.D.   On: 10/04/2020 07:02    Assessment/Plan Active Problems:   Acute on chronic respiratory failure with hypoxia (HCC)   COVID-19 virus infection   Acute respiratory distress syndrome (ARDS) due to COVID-19 virus (HCC)   Pneumothorax on right   1. Acute on chronic respiratory failure hypoxia plan is to continue with oxygen therapy via nasal cannula chest tube can be removed thorax 2. COVID-19 virus infection recovery 3. ARDS treated we'll continue supportive care 4. Pneumothorax resolved can remove chest tube   I have personally seen and evaluated the patient, evaluated laboratory and imaging results, formulated the assessment and plan and placed orders. The Patient requires high  complexity decision making with multiple systems involvement.  Rounds were done with the Respiratory Therapy Director and Staff therapists and discussed with nursing staff also.  Yevonne Pax, MD [facility] Pulmonary Critical Care Medicine Sleep Medicine

## 2020-10-07 LAB — URINALYSIS, ROUTINE W REFLEX MICROSCOPIC
Bilirubin Urine: NEGATIVE
Glucose, UA: NEGATIVE mg/dL
Hgb urine dipstick: NEGATIVE
Ketones, ur: NEGATIVE mg/dL
Leukocytes,Ua: NEGATIVE
Nitrite: NEGATIVE
Protein, ur: 30 mg/dL — AB
Specific Gravity, Urine: 1.023 (ref 1.005–1.030)
pH: 9 — ABNORMAL HIGH (ref 5.0–8.0)

## 2020-10-08 ENCOUNTER — Other Ambulatory Visit (HOSPITAL_COMMUNITY): Payer: BC Managed Care – PPO

## 2020-10-08 LAB — CBC
HCT: 37.7 % — ABNORMAL LOW (ref 39.0–52.0)
Hemoglobin: 12.3 g/dL — ABNORMAL LOW (ref 13.0–17.0)
MCH: 29.1 pg (ref 26.0–34.0)
MCHC: 32.6 g/dL (ref 30.0–36.0)
MCV: 89.3 fL (ref 80.0–100.0)
Platelets: 308 10*3/uL (ref 150–400)
RBC: 4.22 MIL/uL (ref 4.22–5.81)
RDW: 16.3 % — ABNORMAL HIGH (ref 11.5–15.5)
WBC: 9.4 10*3/uL (ref 4.0–10.5)
nRBC: 0 % (ref 0.0–0.2)

## 2020-10-08 LAB — BASIC METABOLIC PANEL
Anion gap: 8 (ref 5–15)
BUN: 18 mg/dL (ref 8–23)
CO2: 25 mmol/L (ref 22–32)
Calcium: 9.3 mg/dL (ref 8.9–10.3)
Chloride: 106 mmol/L (ref 98–111)
Creatinine, Ser: 0.54 mg/dL — ABNORMAL LOW (ref 0.61–1.24)
GFR, Estimated: >60 mL/min (ref >60)
Glucose, Bld: 103 mg/dL — ABNORMAL HIGH (ref 70–99)
Potassium: 4.1 mmol/L (ref 3.5–5.1)
Sodium: 139 mmol/L (ref 135–145)

## 2020-10-08 LAB — MAGNESIUM: Magnesium: 1.9 mg/dL (ref 1.7–2.4)

## 2020-10-09 LAB — URINE CULTURE: Culture: 60000 — AB

## 2020-10-10 LAB — BASIC METABOLIC PANEL
Anion gap: 11 (ref 5–15)
BUN: 11 mg/dL (ref 8–23)
CO2: 23 mmol/L (ref 22–32)
Calcium: 9 mg/dL (ref 8.9–10.3)
Chloride: 98 mmol/L (ref 98–111)
Creatinine, Ser: 0.56 mg/dL — ABNORMAL LOW (ref 0.61–1.24)
GFR, Estimated: >60 mL/min (ref >60)
Glucose, Bld: 128 mg/dL — ABNORMAL HIGH (ref 70–99)
Potassium: 3.6 mmol/L (ref 3.5–5.1)
Sodium: 132 mmol/L — ABNORMAL LOW (ref 135–145)

## 2020-10-10 LAB — CBC
HCT: 38.1 % — ABNORMAL LOW (ref 39.0–52.0)
Hemoglobin: 13 g/dL (ref 13.0–17.0)
MCH: 29.4 pg (ref 26.0–34.0)
MCHC: 34.1 g/dL (ref 30.0–36.0)
MCV: 86.2 fL (ref 80.0–100.0)
Platelets: 305 10*3/uL (ref 150–400)
RBC: 4.42 MIL/uL (ref 4.22–5.81)
RDW: 15.9 % — ABNORMAL HIGH (ref 11.5–15.5)
WBC: 8.6 10*3/uL (ref 4.0–10.5)
nRBC: 0 % (ref 0.0–0.2)

## 2020-10-16 LAB — BASIC METABOLIC PANEL
Anion gap: 10 (ref 5–15)
BUN: 12 mg/dL (ref 8–23)
CO2: 22 mmol/L (ref 22–32)
Calcium: 8.7 mg/dL — ABNORMAL LOW (ref 8.9–10.3)
Chloride: 103 mmol/L (ref 98–111)
Creatinine, Ser: 0.56 mg/dL — ABNORMAL LOW (ref 0.61–1.24)
GFR, Estimated: >60 mL/min (ref >60)
Glucose, Bld: 89 mg/dL (ref 70–99)
Potassium: 3.5 mmol/L (ref 3.5–5.1)
Sodium: 135 mmol/L (ref 135–145)

## 2020-10-16 LAB — CBC
HCT: 37.5 % — ABNORMAL LOW (ref 39.0–52.0)
Hemoglobin: 12.5 g/dL — ABNORMAL LOW (ref 13.0–17.0)
MCH: 29.1 pg (ref 26.0–34.0)
MCHC: 33.3 g/dL (ref 30.0–36.0)
MCV: 87.2 fL (ref 80.0–100.0)
Platelets: 284 10*3/uL (ref 150–400)
RBC: 4.3 MIL/uL (ref 4.22–5.81)
RDW: 16.3 % — ABNORMAL HIGH (ref 11.5–15.5)
WBC: 10.4 10*3/uL (ref 4.0–10.5)
nRBC: 0 % (ref 0.0–0.2)

## 2020-10-20 LAB — BASIC METABOLIC PANEL
Anion gap: 12 (ref 5–15)
BUN: 13 mg/dL (ref 8–23)
CO2: 20 mmol/L — ABNORMAL LOW (ref 22–32)
Calcium: 8.5 mg/dL — ABNORMAL LOW (ref 8.9–10.3)
Chloride: 104 mmol/L (ref 98–111)
Creatinine, Ser: 0.64 mg/dL (ref 0.61–1.24)
GFR, Estimated: >60 mL/min (ref >60)
Glucose, Bld: 142 mg/dL — ABNORMAL HIGH (ref 70–99)
Potassium: 3.4 mmol/L — ABNORMAL LOW (ref 3.5–5.1)
Sodium: 136 mmol/L (ref 135–145)

## 2020-10-21 LAB — BASIC METABOLIC PANEL
Anion gap: 13 (ref 5–15)
BUN: 13 mg/dL (ref 8–23)
CO2: 20 mmol/L — ABNORMAL LOW (ref 22–32)
Calcium: 8.8 mg/dL — ABNORMAL LOW (ref 8.9–10.3)
Chloride: 102 mmol/L (ref 98–111)
Creatinine, Ser: 0.43 mg/dL — ABNORMAL LOW (ref 0.61–1.24)
GFR, Estimated: >60 mL/min (ref >60)
Glucose, Bld: 87 mg/dL (ref 70–99)
Potassium: 3.2 mmol/L — ABNORMAL LOW (ref 3.5–5.1)
Sodium: 135 mmol/L (ref 135–145)

## 2020-10-21 LAB — CBC
HCT: 37.8 % — ABNORMAL LOW (ref 39.0–52.0)
Hemoglobin: 12.5 g/dL — ABNORMAL LOW (ref 13.0–17.0)
MCH: 29.3 pg (ref 26.0–34.0)
MCHC: 33.1 g/dL (ref 30.0–36.0)
MCV: 88.7 fL (ref 80.0–100.0)
Platelets: 245 10*3/uL (ref 150–400)
RBC: 4.26 MIL/uL (ref 4.22–5.81)
RDW: 17 % — ABNORMAL HIGH (ref 11.5–15.5)
WBC: 10 10*3/uL (ref 4.0–10.5)
nRBC: 0 % (ref 0.0–0.2)

## 2020-10-21 LAB — MAGNESIUM: Magnesium: 1.7 mg/dL (ref 1.7–2.4)

## 2020-10-22 LAB — BASIC METABOLIC PANEL
Anion gap: 9 (ref 5–15)
BUN: 13 mg/dL (ref 8–23)
CO2: 23 mmol/L (ref 22–32)
Calcium: 8.9 mg/dL (ref 8.9–10.3)
Chloride: 104 mmol/L (ref 98–111)
Creatinine, Ser: 0.48 mg/dL — ABNORMAL LOW (ref 0.61–1.24)
GFR, Estimated: >60 mL/min (ref >60)
Glucose, Bld: 81 mg/dL (ref 70–99)
Potassium: 3.3 mmol/L — ABNORMAL LOW (ref 3.5–5.1)
Sodium: 136 mmol/L (ref 135–145)

## 2020-10-22 LAB — PHOSPHORUS: Phosphorus: 4.4 mg/dL (ref 2.5–4.6)

## 2020-10-22 LAB — MAGNESIUM: Magnesium: 1.8 mg/dL (ref 1.7–2.4)

## 2020-10-23 LAB — POTASSIUM: Potassium: 3.2 mmol/L — ABNORMAL LOW (ref 3.5–5.1)

## 2020-10-24 LAB — BASIC METABOLIC PANEL
Anion gap: 12 (ref 5–15)
BUN: 10 mg/dL (ref 8–23)
CO2: 20 mmol/L — ABNORMAL LOW (ref 22–32)
Calcium: 8.7 mg/dL — ABNORMAL LOW (ref 8.9–10.3)
Chloride: 105 mmol/L (ref 98–111)
Creatinine, Ser: 0.53 mg/dL — ABNORMAL LOW (ref 0.61–1.24)
GFR, Estimated: >60 mL/min (ref >60)
Glucose, Bld: 81 mg/dL (ref 70–99)
Potassium: 3.5 mmol/L (ref 3.5–5.1)
Sodium: 137 mmol/L (ref 135–145)

## 2020-10-24 LAB — MAGNESIUM: Magnesium: 1.6 mg/dL — ABNORMAL LOW (ref 1.7–2.4)

## 2020-10-25 LAB — POTASSIUM: Potassium: 3.5 mmol/L (ref 3.5–5.1)

## 2020-10-25 LAB — MAGNESIUM: Magnesium: 1.6 mg/dL — ABNORMAL LOW (ref 1.7–2.4)

## 2020-10-26 LAB — BASIC METABOLIC PANEL
Anion gap: 12 (ref 5–15)
BUN: 9 mg/dL (ref 8–23)
CO2: 23 mmol/L (ref 22–32)
Calcium: 8.6 mg/dL — ABNORMAL LOW (ref 8.9–10.3)
Chloride: 101 mmol/L (ref 98–111)
Creatinine, Ser: 0.5 mg/dL — ABNORMAL LOW (ref 0.61–1.24)
GFR, Estimated: >60 mL/min (ref >60)
Glucose, Bld: 84 mg/dL (ref 70–99)
Potassium: 3.3 mmol/L — ABNORMAL LOW (ref 3.5–5.1)
Sodium: 136 mmol/L (ref 135–145)

## 2020-10-26 LAB — MAGNESIUM: Magnesium: 1.8 mg/dL (ref 1.7–2.4)

## 2020-10-27 LAB — POTASSIUM: Potassium: 3.5 mmol/L (ref 3.5–5.1)

## 2021-08-28 IMAGING — DX DG ABD PORTABLE 1V
2 series · 2 of 2 positions shown · non-contrast
Comparison: 09/13/2020

CLINICAL DATA: Abdominal distension.  Evaluate for ileus.

EXAM:
PORTABLE ABDOMEN - 1 VIEW

[abdomen kub (1 of 2)]
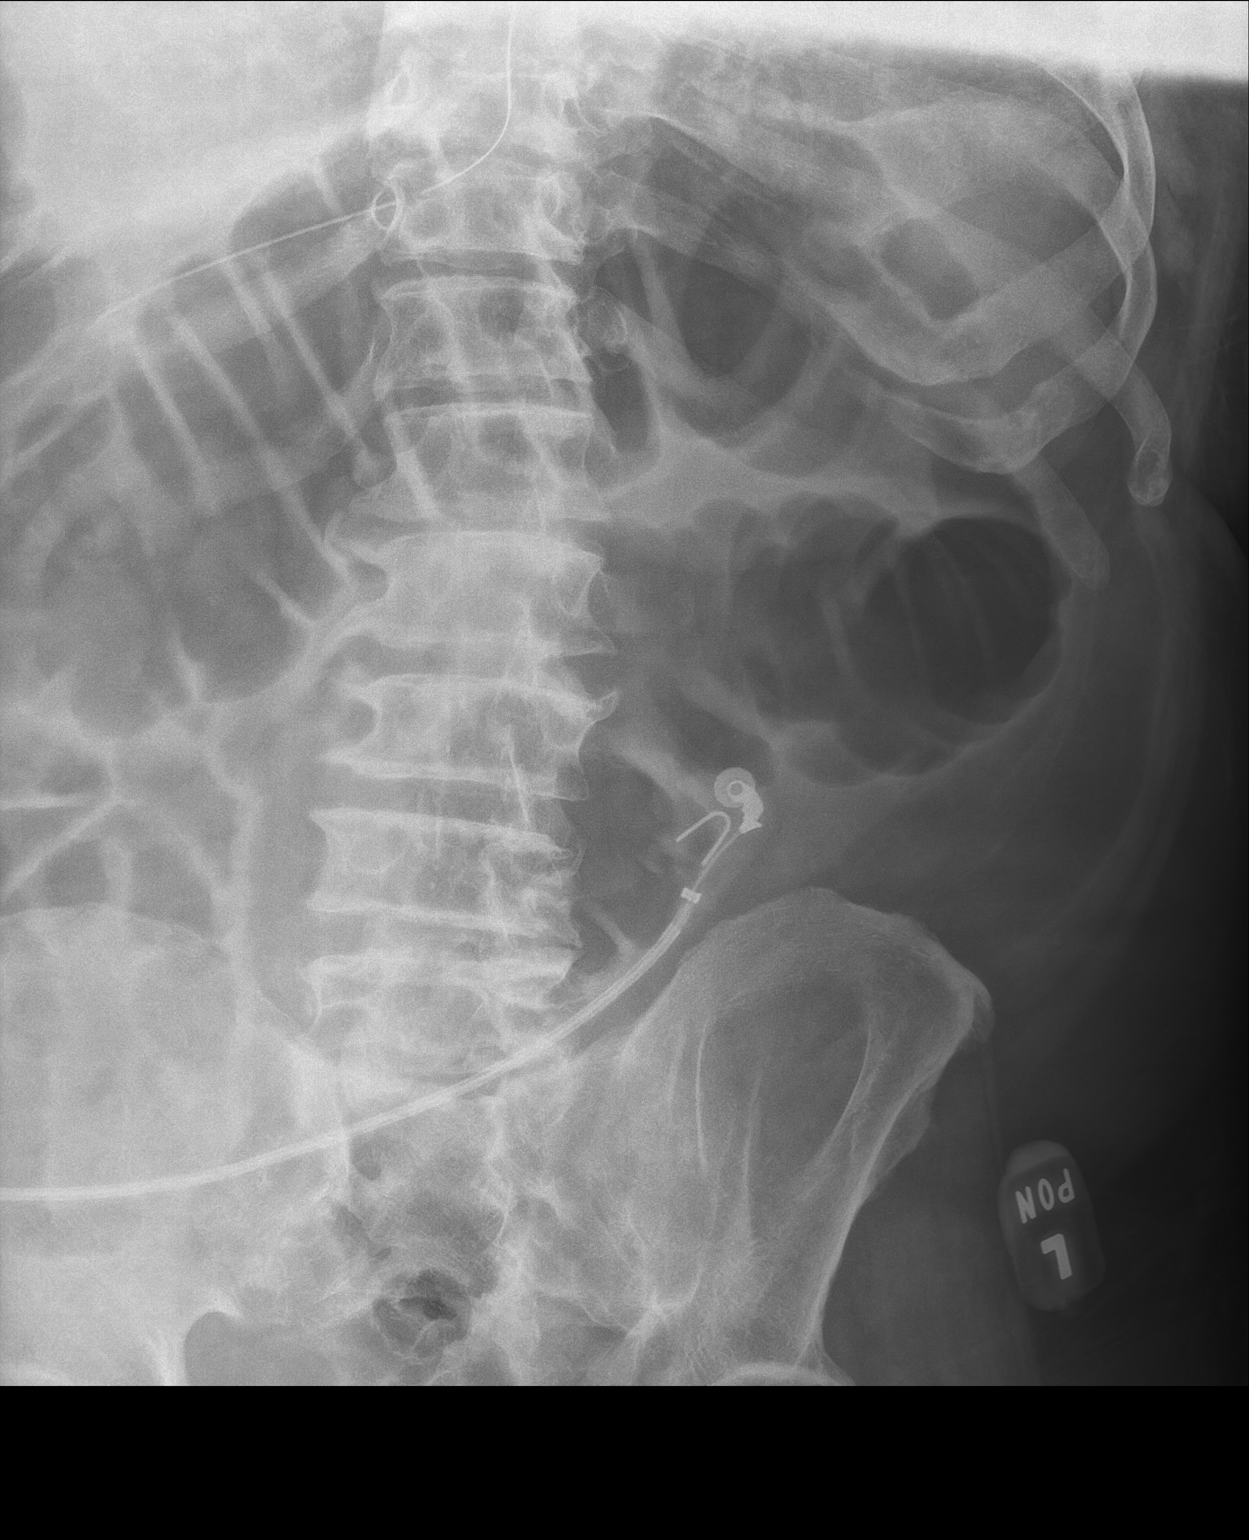

[abdomen kub (2 of 2)]
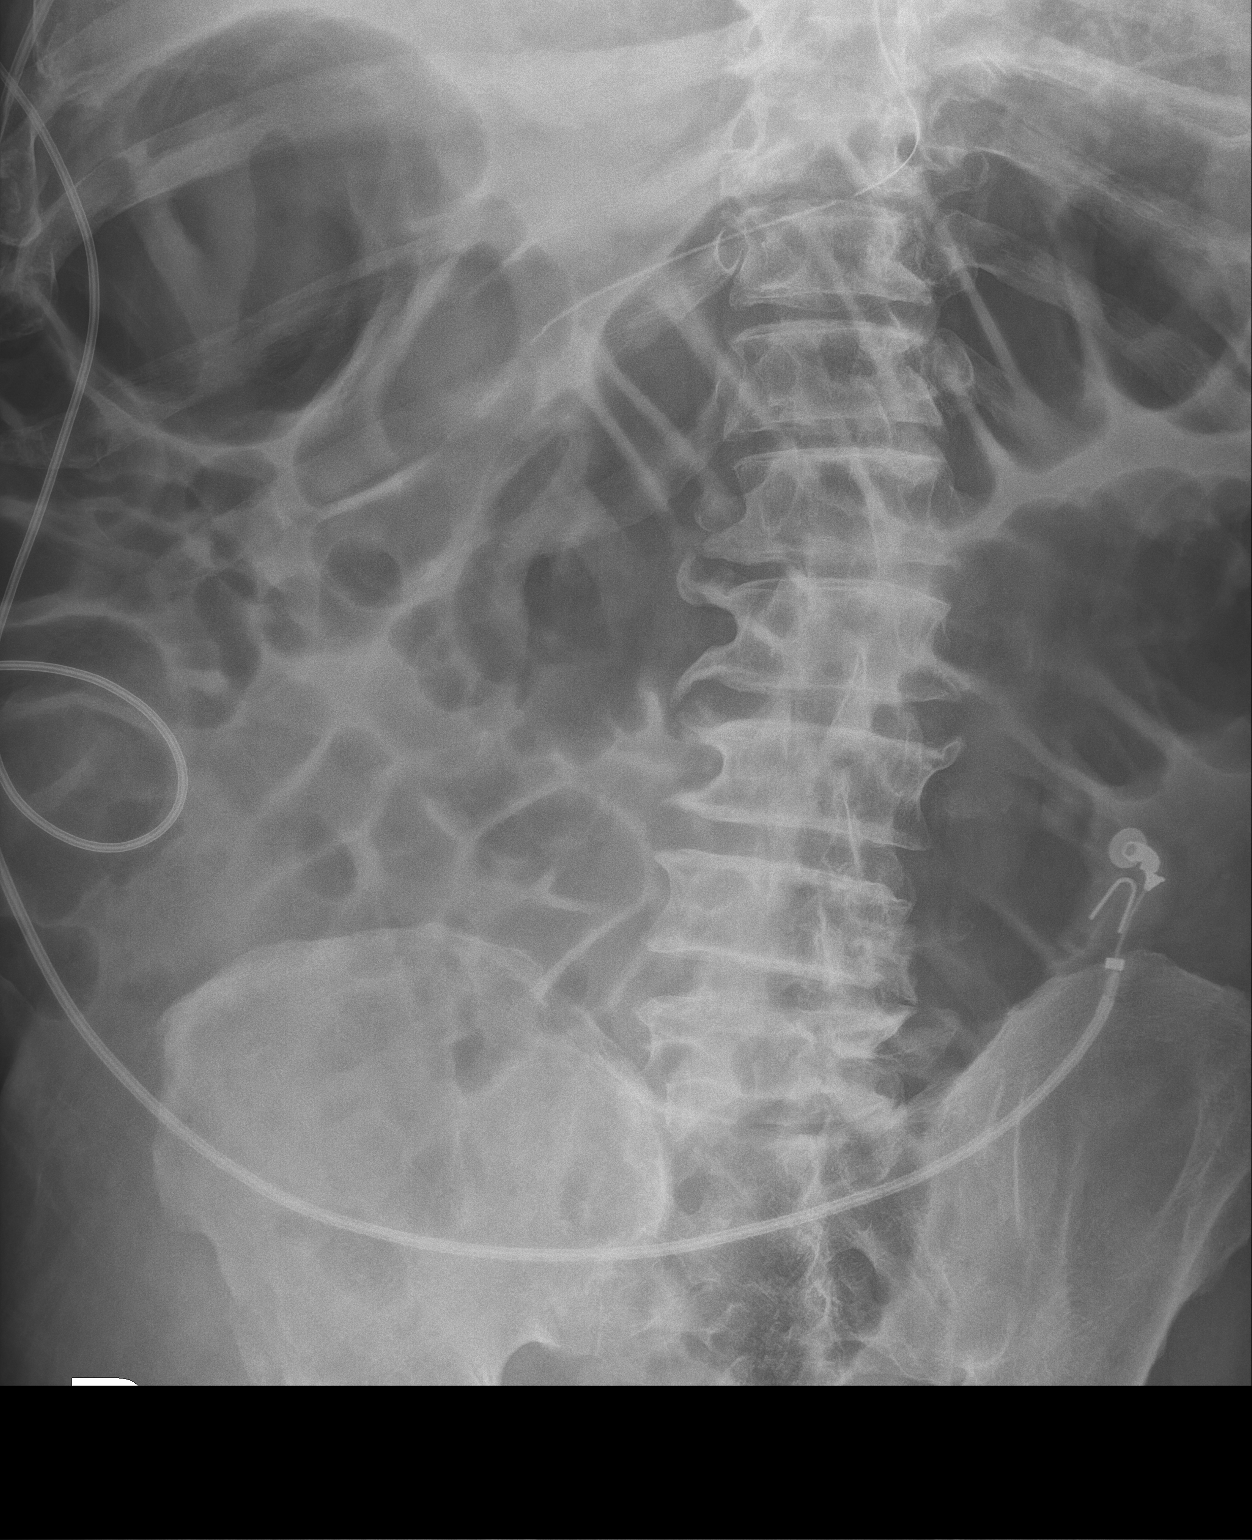

[2 of 2 positions shown; findings below may reference images not displayed]

FINDINGS: NG tube is in place with tip and side port below the GE junction.
Progressive gaseous distension of the colon up to the level of the
proximal rectum compatible with ileus. No small bowel dilatation
identified.
IMPRESSION: Progressive gaseous distension of the colon up to the level of the
proximal rectum compatible with worsening ileus.

## 2021-09-01 IMAGING — DX DG ABD PORTABLE 1V
1 series · 1 of 1 positions shown · non-contrast
Comparison: 09/17/2020.

CLINICAL DATA: Ileus.  Abdominal distention.

EXAM:
PORTABLE ABDOMEN - 1 VIEW

[abdomen supine]
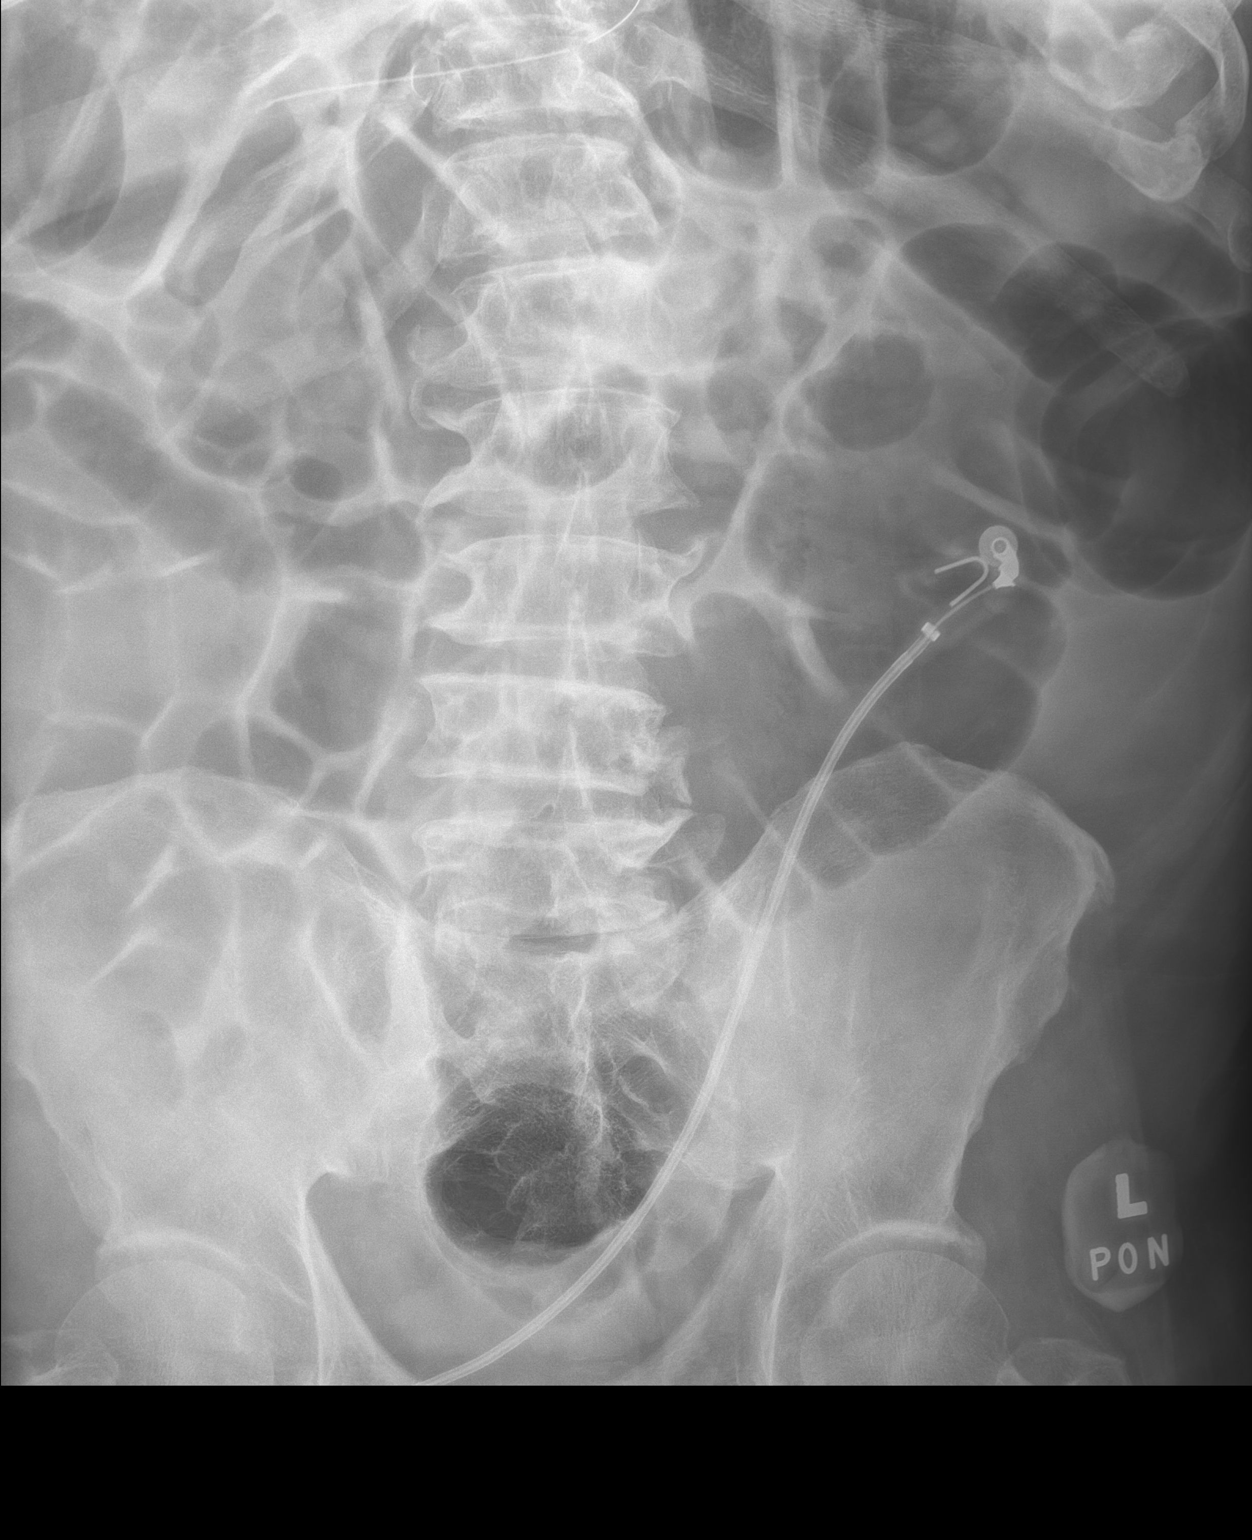

[1 of 1 positions shown; findings below may reference images not displayed]

FINDINGS: NG tube noted stable position with tip over the stomach. Distended
loops of small and large bowel again noted without interim change.
Findings again consistent adynamic ileus. Continued follow-up exam
suggested to demonstrate resolution and to exclude bowel
obstruction. Degenerative changes scoliosis lumbar spine.
IMPRESSION: 1. NG tube noted stable position with tip over the stomach.
2. Distended loops of small and large bowel again noted without
interim change. Findings again consistent adynamic ileus.

## 2021-09-17 IMAGING — DX DG CHEST 1V PORT
1 series · 1 of 1 positions shown · non-contrast
Comparison: October 04, 2020

CLINICAL DATA: Pneumonia.  Pneumothorax.

EXAM:
PORTABLE CHEST 1 VIEW

[chest ap]
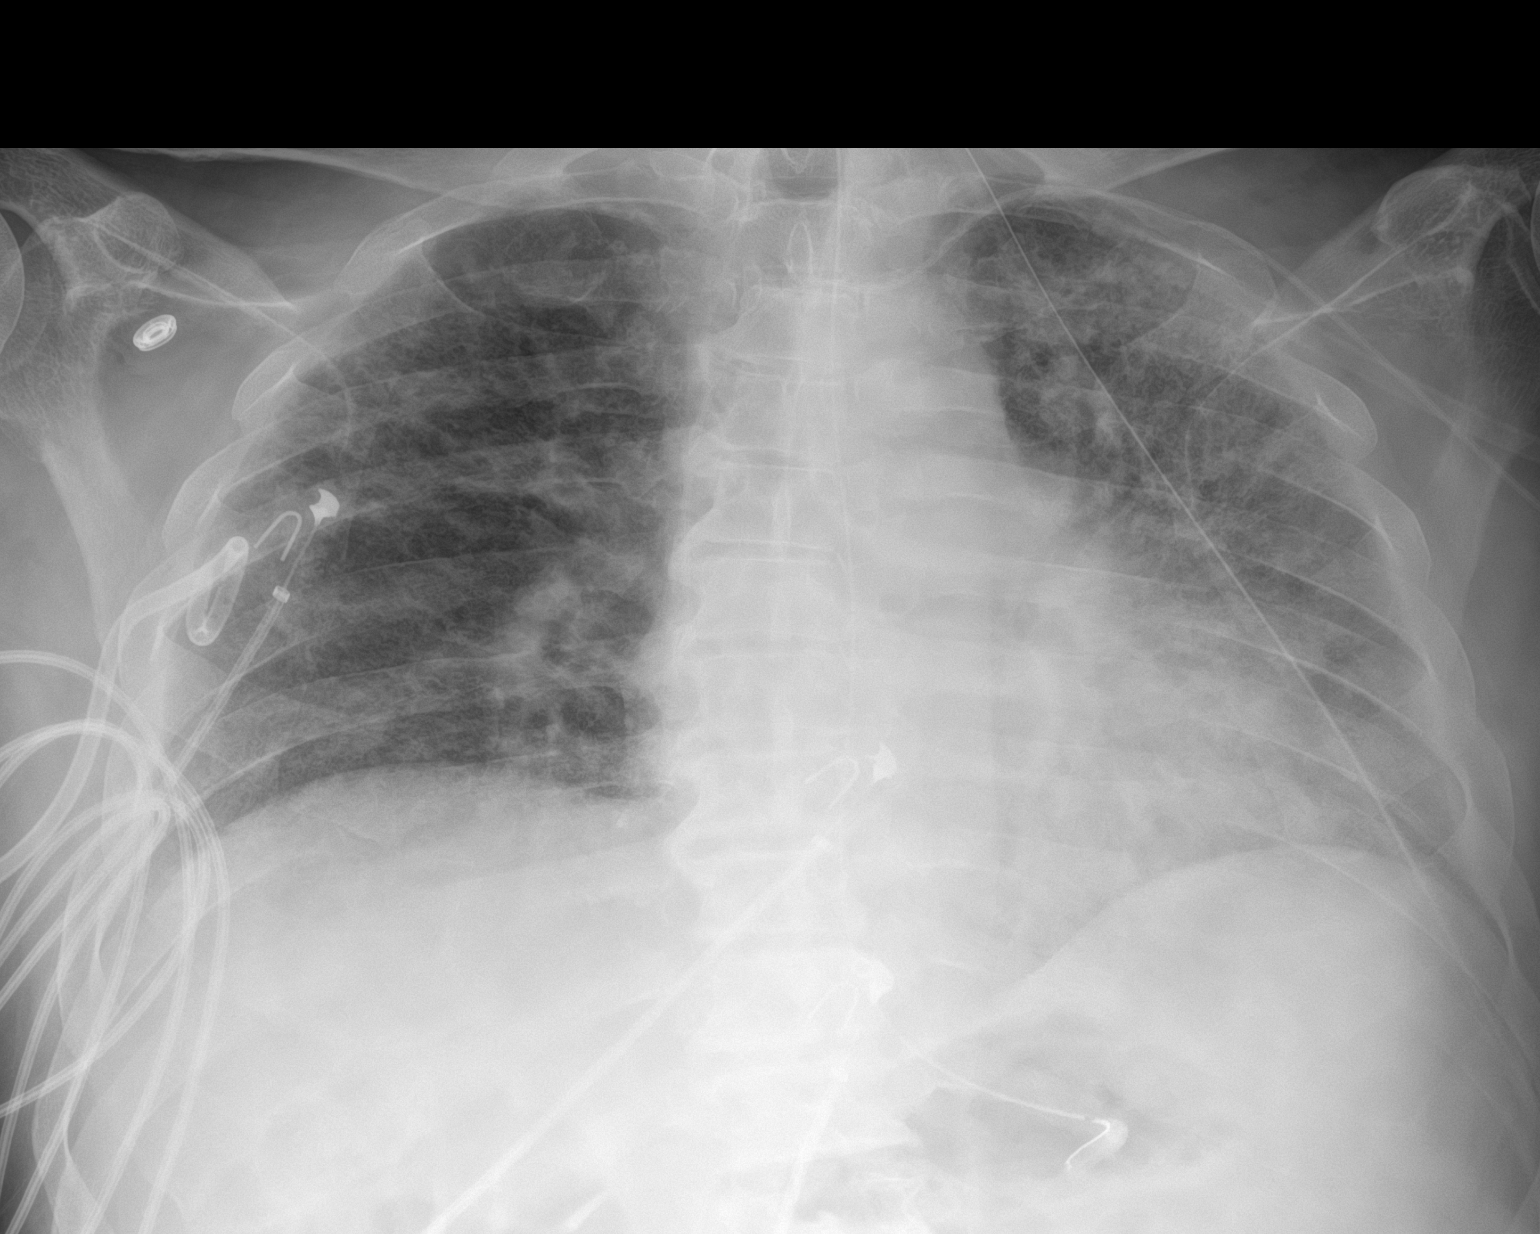

[1 of 1 positions shown; findings below may reference images not displayed]

FINDINGS: The right chest tube is stable. No pneumothorax. Bilateral pulmonary
opacities, left greater than right, are stable. The NG tube
terminates in the stomach. No other interval changes.
IMPRESSION: 1. Support apparatus as above. No pneumothorax with right chest
tube.
2. Left greater than right pulmonary infiltrates are stable.
